# Patient Record
Sex: Male | Born: 1935
Health system: Southern US, Community
[De-identification: ages and names within clinical notes are randomized; demographics above are authoritative.]

## PROBLEM LIST (undated history)

## (undated) DIAGNOSIS — S32040A Wedge compression fracture of fourth lumbar vertebra, initial encounter for closed fracture: Secondary | ICD-10-CM

## (undated) DIAGNOSIS — S22080A Wedge compression fracture of T11-T12 vertebra, initial encounter for closed fracture: Secondary | ICD-10-CM

## (undated) DIAGNOSIS — I471 Supraventricular tachycardia, unspecified: Secondary | ICD-10-CM

## (undated) DIAGNOSIS — E785 Hyperlipidemia, unspecified: Secondary | ICD-10-CM

## (undated) DIAGNOSIS — E119 Type 2 diabetes mellitus without complications: Secondary | ICD-10-CM

## (undated) DIAGNOSIS — G4730 Sleep apnea, unspecified: Secondary | ICD-10-CM

## (undated) DIAGNOSIS — I4891 Unspecified atrial fibrillation: Secondary | ICD-10-CM

## (undated) DIAGNOSIS — I10 Essential (primary) hypertension: Secondary | ICD-10-CM

## (undated) DIAGNOSIS — E559 Vitamin D deficiency, unspecified: Secondary | ICD-10-CM

## (undated) DIAGNOSIS — I499 Cardiac arrhythmia, unspecified: Secondary | ICD-10-CM

## (undated) HISTORY — PX: CARDIOVERSION: SHX1299

## (undated) HISTORY — DX: Vitamin D deficiency, unspecified: E55.9

## (undated) HISTORY — DX: Essential (primary) hypertension: I10

## (undated) HISTORY — DX: Sleep apnea, unspecified: G47.30

## (undated) HISTORY — PX: HIATAL HERNIA REPAIR: SHX195

## (undated) HISTORY — DX: Unspecified atrial fibrillation: I48.91

## (undated) HISTORY — DX: Supraventricular tachycardia, unspecified: I47.10

## (undated) HISTORY — PX: KYPHOPLASTY: SHX5884

## (undated) HISTORY — DX: Type 2 diabetes mellitus without complications: E11.9

## (undated) HISTORY — DX: Wedge compression fracture of T11-T12 vertebra, initial encounter for closed fracture: S22.080A

## (undated) HISTORY — DX: Wedge compression fracture of fourth lumbar vertebra, initial encounter for closed fracture: S32.040A

## (undated) HISTORY — DX: Supraventricular tachycardia: I47.1

## (undated) HISTORY — PX: ABLATION: SHX5711

## (undated) HISTORY — DX: Hyperlipidemia, unspecified: E78.5

## (undated) HISTORY — PX: SKIN CANCER EXCISION: SHX779

## (undated) HISTORY — DX: Cardiac arrhythmia, unspecified: I49.9

---

## 2011-11-22 DIAGNOSIS — L600 Ingrowing nail: Secondary | ICD-10-CM | POA: Diagnosis not present

## 2011-11-22 DIAGNOSIS — L84 Corns and callosities: Secondary | ICD-10-CM | POA: Diagnosis not present

## 2011-11-22 DIAGNOSIS — E1149 Type 2 diabetes mellitus with other diabetic neurological complication: Secondary | ICD-10-CM | POA: Diagnosis not present

## 2011-11-28 DIAGNOSIS — I10 Essential (primary) hypertension: Secondary | ICD-10-CM | POA: Diagnosis not present

## 2011-11-28 DIAGNOSIS — N058 Unspecified nephritic syndrome with other morphologic changes: Secondary | ICD-10-CM | POA: Diagnosis not present

## 2011-11-28 DIAGNOSIS — E119 Type 2 diabetes mellitus without complications: Secondary | ICD-10-CM | POA: Diagnosis not present

## 2011-11-28 DIAGNOSIS — E785 Hyperlipidemia, unspecified: Secondary | ICD-10-CM | POA: Diagnosis not present

## 2011-12-12 DIAGNOSIS — I498 Other specified cardiac arrhythmias: Secondary | ICD-10-CM | POA: Diagnosis not present

## 2012-01-27 DIAGNOSIS — L600 Ingrowing nail: Secondary | ICD-10-CM | POA: Diagnosis not present

## 2012-01-27 DIAGNOSIS — L84 Corns and callosities: Secondary | ICD-10-CM | POA: Diagnosis not present

## 2012-01-27 DIAGNOSIS — E1149 Type 2 diabetes mellitus with other diabetic neurological complication: Secondary | ICD-10-CM | POA: Diagnosis not present

## 2012-03-25 DIAGNOSIS — E119 Type 2 diabetes mellitus without complications: Secondary | ICD-10-CM | POA: Diagnosis not present

## 2012-03-26 DIAGNOSIS — I6789 Other cerebrovascular disease: Secondary | ICD-10-CM | POA: Diagnosis not present

## 2012-03-26 DIAGNOSIS — E119 Type 2 diabetes mellitus without complications: Secondary | ICD-10-CM | POA: Diagnosis not present

## 2012-03-26 DIAGNOSIS — I10 Essential (primary) hypertension: Secondary | ICD-10-CM | POA: Diagnosis not present

## 2012-03-26 DIAGNOSIS — I4891 Unspecified atrial fibrillation: Secondary | ICD-10-CM | POA: Diagnosis not present

## 2012-03-30 DIAGNOSIS — L84 Corns and callosities: Secondary | ICD-10-CM | POA: Diagnosis not present

## 2012-03-30 DIAGNOSIS — L600 Ingrowing nail: Secondary | ICD-10-CM | POA: Diagnosis not present

## 2012-03-30 DIAGNOSIS — E1149 Type 2 diabetes mellitus with other diabetic neurological complication: Secondary | ICD-10-CM | POA: Diagnosis not present

## 2012-04-20 DIAGNOSIS — C44519 Basal cell carcinoma of skin of other part of trunk: Secondary | ICD-10-CM | POA: Diagnosis not present

## 2012-04-20 DIAGNOSIS — Z85828 Personal history of other malignant neoplasm of skin: Secondary | ICD-10-CM | POA: Diagnosis not present

## 2012-04-20 DIAGNOSIS — L570 Actinic keratosis: Secondary | ICD-10-CM | POA: Diagnosis not present

## 2012-05-08 DIAGNOSIS — C4491 Basal cell carcinoma of skin, unspecified: Secondary | ICD-10-CM | POA: Diagnosis not present

## 2012-05-08 DIAGNOSIS — C44519 Basal cell carcinoma of skin of other part of trunk: Secondary | ICD-10-CM | POA: Diagnosis not present

## 2012-06-01 DIAGNOSIS — L600 Ingrowing nail: Secondary | ICD-10-CM | POA: Diagnosis not present

## 2012-06-01 DIAGNOSIS — E1159 Type 2 diabetes mellitus with other circulatory complications: Secondary | ICD-10-CM | POA: Diagnosis not present

## 2012-06-01 DIAGNOSIS — L84 Corns and callosities: Secondary | ICD-10-CM | POA: Diagnosis not present

## 2012-06-22 DIAGNOSIS — Z85828 Personal history of other malignant neoplasm of skin: Secondary | ICD-10-CM | POA: Diagnosis not present

## 2012-06-22 DIAGNOSIS — L738 Other specified follicular disorders: Secondary | ICD-10-CM | POA: Diagnosis not present

## 2012-06-22 DIAGNOSIS — L570 Actinic keratosis: Secondary | ICD-10-CM | POA: Diagnosis not present

## 2012-06-29 DIAGNOSIS — E119 Type 2 diabetes mellitus without complications: Secondary | ICD-10-CM | POA: Diagnosis not present

## 2012-08-03 DIAGNOSIS — L84 Corns and callosities: Secondary | ICD-10-CM | POA: Diagnosis not present

## 2012-08-03 DIAGNOSIS — L600 Ingrowing nail: Secondary | ICD-10-CM | POA: Diagnosis not present

## 2012-08-03 DIAGNOSIS — L97509 Non-pressure chronic ulcer of other part of unspecified foot with unspecified severity: Secondary | ICD-10-CM | POA: Diagnosis not present

## 2012-08-03 DIAGNOSIS — E1149 Type 2 diabetes mellitus with other diabetic neurological complication: Secondary | ICD-10-CM | POA: Diagnosis not present

## 2012-08-10 DIAGNOSIS — L97509 Non-pressure chronic ulcer of other part of unspecified foot with unspecified severity: Secondary | ICD-10-CM | POA: Diagnosis not present

## 2012-09-07 DIAGNOSIS — E119 Type 2 diabetes mellitus without complications: Secondary | ICD-10-CM | POA: Diagnosis not present

## 2012-09-07 DIAGNOSIS — I471 Supraventricular tachycardia: Secondary | ICD-10-CM | POA: Diagnosis not present

## 2012-09-07 DIAGNOSIS — D485 Neoplasm of uncertain behavior of skin: Secondary | ICD-10-CM | POA: Diagnosis not present

## 2012-09-10 DIAGNOSIS — I471 Supraventricular tachycardia: Secondary | ICD-10-CM | POA: Diagnosis not present

## 2012-09-17 DIAGNOSIS — D485 Neoplasm of uncertain behavior of skin: Secondary | ICD-10-CM | POA: Diagnosis not present

## 2012-09-17 DIAGNOSIS — C44319 Basal cell carcinoma of skin of other parts of face: Secondary | ICD-10-CM | POA: Diagnosis not present

## 2012-09-17 DIAGNOSIS — Z85828 Personal history of other malignant neoplasm of skin: Secondary | ICD-10-CM | POA: Diagnosis not present

## 2012-09-17 DIAGNOSIS — D0439 Carcinoma in situ of skin of other parts of face: Secondary | ICD-10-CM | POA: Diagnosis not present

## 2012-09-18 DIAGNOSIS — I471 Supraventricular tachycardia: Secondary | ICD-10-CM | POA: Diagnosis not present

## 2012-09-24 DIAGNOSIS — I498 Other specified cardiac arrhythmias: Secondary | ICD-10-CM | POA: Diagnosis not present

## 2012-09-28 DIAGNOSIS — I059 Rheumatic mitral valve disease, unspecified: Secondary | ICD-10-CM | POA: Diagnosis not present

## 2012-09-28 DIAGNOSIS — G4733 Obstructive sleep apnea (adult) (pediatric): Secondary | ICD-10-CM | POA: Diagnosis not present

## 2012-09-28 DIAGNOSIS — E119 Type 2 diabetes mellitus without complications: Secondary | ICD-10-CM | POA: Diagnosis not present

## 2012-09-28 DIAGNOSIS — I4892 Unspecified atrial flutter: Secondary | ICD-10-CM | POA: Diagnosis not present

## 2012-09-28 DIAGNOSIS — I10 Essential (primary) hypertension: Secondary | ICD-10-CM | POA: Diagnosis not present

## 2012-09-28 DIAGNOSIS — I471 Supraventricular tachycardia: Secondary | ICD-10-CM | POA: Diagnosis not present

## 2012-09-28 DIAGNOSIS — Z85828 Personal history of other malignant neoplasm of skin: Secondary | ICD-10-CM | POA: Diagnosis not present

## 2012-09-28 DIAGNOSIS — E785 Hyperlipidemia, unspecified: Secondary | ICD-10-CM | POA: Diagnosis not present

## 2012-09-28 DIAGNOSIS — R9431 Abnormal electrocardiogram [ECG] [EKG]: Secondary | ICD-10-CM | POA: Diagnosis not present

## 2012-09-28 DIAGNOSIS — I079 Rheumatic tricuspid valve disease, unspecified: Secondary | ICD-10-CM | POA: Diagnosis not present

## 2012-09-28 DIAGNOSIS — I4891 Unspecified atrial fibrillation: Secondary | ICD-10-CM | POA: Diagnosis not present

## 2012-09-28 DIAGNOSIS — I4460 Unspecified fascicular block: Secondary | ICD-10-CM | POA: Diagnosis not present

## 2012-09-28 DIAGNOSIS — Z7982 Long term (current) use of aspirin: Secondary | ICD-10-CM | POA: Diagnosis not present

## 2012-09-28 DIAGNOSIS — Z87891 Personal history of nicotine dependence: Secondary | ICD-10-CM | POA: Diagnosis not present

## 2012-09-28 DIAGNOSIS — Z7901 Long term (current) use of anticoagulants: Secondary | ICD-10-CM | POA: Diagnosis not present

## 2012-09-28 DIAGNOSIS — I440 Atrioventricular block, first degree: Secondary | ICD-10-CM | POA: Diagnosis not present

## 2012-09-28 DIAGNOSIS — Q211 Atrial septal defect: Secondary | ICD-10-CM | POA: Diagnosis not present

## 2012-09-28 DIAGNOSIS — I4949 Other premature depolarization: Secondary | ICD-10-CM | POA: Diagnosis not present

## 2012-10-03 DIAGNOSIS — Z7982 Long term (current) use of aspirin: Secondary | ICD-10-CM | POA: Diagnosis not present

## 2012-10-03 DIAGNOSIS — I10 Essential (primary) hypertension: Secondary | ICD-10-CM | POA: Diagnosis not present

## 2012-10-03 DIAGNOSIS — M7989 Other specified soft tissue disorders: Secondary | ICD-10-CM | POA: Diagnosis not present

## 2012-10-03 DIAGNOSIS — J9819 Other pulmonary collapse: Secondary | ICD-10-CM | POA: Diagnosis not present

## 2012-10-03 DIAGNOSIS — I4891 Unspecified atrial fibrillation: Secondary | ICD-10-CM | POA: Diagnosis not present

## 2012-10-03 DIAGNOSIS — G4730 Sleep apnea, unspecified: Secondary | ICD-10-CM | POA: Diagnosis not present

## 2012-10-03 DIAGNOSIS — R0789 Other chest pain: Secondary | ICD-10-CM | POA: Diagnosis not present

## 2012-10-03 DIAGNOSIS — R609 Edema, unspecified: Secondary | ICD-10-CM | POA: Diagnosis not present

## 2012-10-03 DIAGNOSIS — E785 Hyperlipidemia, unspecified: Secondary | ICD-10-CM | POA: Diagnosis not present

## 2012-10-03 DIAGNOSIS — E119 Type 2 diabetes mellitus without complications: Secondary | ICD-10-CM | POA: Diagnosis not present

## 2012-10-03 DIAGNOSIS — Z9889 Other specified postprocedural states: Secondary | ICD-10-CM | POA: Diagnosis not present

## 2012-10-03 DIAGNOSIS — R29898 Other symptoms and signs involving the musculoskeletal system: Secondary | ICD-10-CM | POA: Diagnosis not present

## 2012-10-03 DIAGNOSIS — R5381 Other malaise: Secondary | ICD-10-CM | POA: Diagnosis not present

## 2012-10-03 DIAGNOSIS — Z85828 Personal history of other malignant neoplasm of skin: Secondary | ICD-10-CM | POA: Diagnosis not present

## 2012-10-03 DIAGNOSIS — R9431 Abnormal electrocardiogram [ECG] [EKG]: Secondary | ICD-10-CM | POA: Diagnosis not present

## 2012-10-03 DIAGNOSIS — Z87891 Personal history of nicotine dependence: Secondary | ICD-10-CM | POA: Diagnosis not present

## 2012-10-03 DIAGNOSIS — Z7901 Long term (current) use of anticoagulants: Secondary | ICD-10-CM | POA: Diagnosis not present

## 2012-10-03 DIAGNOSIS — R002 Palpitations: Secondary | ICD-10-CM | POA: Diagnosis not present

## 2012-10-03 DIAGNOSIS — R5383 Other fatigue: Secondary | ICD-10-CM | POA: Diagnosis not present

## 2012-10-05 DIAGNOSIS — C44319 Basal cell carcinoma of skin of other parts of face: Secondary | ICD-10-CM | POA: Diagnosis not present

## 2012-10-07 DIAGNOSIS — L600 Ingrowing nail: Secondary | ICD-10-CM | POA: Diagnosis not present

## 2012-10-07 DIAGNOSIS — E1149 Type 2 diabetes mellitus with other diabetic neurological complication: Secondary | ICD-10-CM | POA: Diagnosis not present

## 2012-10-07 DIAGNOSIS — L84 Corns and callosities: Secondary | ICD-10-CM | POA: Diagnosis not present

## 2012-10-09 DIAGNOSIS — E785 Hyperlipidemia, unspecified: Secondary | ICD-10-CM | POA: Diagnosis not present

## 2012-10-09 DIAGNOSIS — E119 Type 2 diabetes mellitus without complications: Secondary | ICD-10-CM | POA: Diagnosis not present

## 2012-10-09 DIAGNOSIS — I10 Essential (primary) hypertension: Secondary | ICD-10-CM | POA: Diagnosis not present

## 2012-11-17 DIAGNOSIS — E875 Hyperkalemia: Secondary | ICD-10-CM | POA: Diagnosis not present

## 2013-01-29 DIAGNOSIS — L600 Ingrowing nail: Secondary | ICD-10-CM | POA: Diagnosis not present

## 2013-01-29 DIAGNOSIS — L84 Corns and callosities: Secondary | ICD-10-CM | POA: Diagnosis not present

## 2013-01-29 DIAGNOSIS — E1149 Type 2 diabetes mellitus with other diabetic neurological complication: Secondary | ICD-10-CM | POA: Diagnosis not present

## 2013-02-04 DIAGNOSIS — I4891 Unspecified atrial fibrillation: Secondary | ICD-10-CM | POA: Diagnosis not present

## 2013-04-05 DIAGNOSIS — L600 Ingrowing nail: Secondary | ICD-10-CM | POA: Diagnosis not present

## 2013-04-05 DIAGNOSIS — E1149 Type 2 diabetes mellitus with other diabetic neurological complication: Secondary | ICD-10-CM | POA: Diagnosis not present

## 2013-04-05 DIAGNOSIS — L97509 Non-pressure chronic ulcer of other part of unspecified foot with unspecified severity: Secondary | ICD-10-CM | POA: Diagnosis not present

## 2013-04-05 DIAGNOSIS — L84 Corns and callosities: Secondary | ICD-10-CM | POA: Diagnosis not present

## 2013-04-14 DIAGNOSIS — L97509 Non-pressure chronic ulcer of other part of unspecified foot with unspecified severity: Secondary | ICD-10-CM | POA: Diagnosis not present

## 2013-06-07 DIAGNOSIS — L97509 Non-pressure chronic ulcer of other part of unspecified foot with unspecified severity: Secondary | ICD-10-CM | POA: Diagnosis not present

## 2013-06-08 DIAGNOSIS — L03119 Cellulitis of unspecified part of limb: Secondary | ICD-10-CM | POA: Diagnosis not present

## 2013-06-08 DIAGNOSIS — M79609 Pain in unspecified limb: Secondary | ICD-10-CM | POA: Diagnosis not present

## 2013-06-08 DIAGNOSIS — L02619 Cutaneous abscess of unspecified foot: Secondary | ICD-10-CM | POA: Diagnosis not present

## 2013-06-08 DIAGNOSIS — L97509 Non-pressure chronic ulcer of other part of unspecified foot with unspecified severity: Secondary | ICD-10-CM | POA: Diagnosis not present

## 2013-06-09 ENCOUNTER — Ambulatory Visit (INDEPENDENT_AMBULATORY_CARE_PROVIDER_SITE_OTHER): Payer: Medicare Other | Admitting: Family Medicine

## 2013-06-09 ENCOUNTER — Encounter: Payer: Self-pay | Admitting: Family Medicine

## 2013-06-09 VITALS — BP 152/90 | HR 91 | Temp 96.3°F | Ht 70.0 in | Wt 208.0 lb

## 2013-06-09 DIAGNOSIS — E119 Type 2 diabetes mellitus without complications: Secondary | ICD-10-CM | POA: Diagnosis not present

## 2013-06-09 DIAGNOSIS — I499 Cardiac arrhythmia, unspecified: Secondary | ICD-10-CM

## 2013-06-09 DIAGNOSIS — E559 Vitamin D deficiency, unspecified: Secondary | ICD-10-CM | POA: Diagnosis not present

## 2013-06-09 DIAGNOSIS — E785 Hyperlipidemia, unspecified: Secondary | ICD-10-CM

## 2013-06-09 DIAGNOSIS — IMO0002 Reserved for concepts with insufficient information to code with codable children: Secondary | ICD-10-CM

## 2013-06-09 DIAGNOSIS — I10 Essential (primary) hypertension: Secondary | ICD-10-CM

## 2013-06-09 DIAGNOSIS — I4891 Unspecified atrial fibrillation: Secondary | ICD-10-CM

## 2013-06-09 DIAGNOSIS — M549 Dorsalgia, unspecified: Secondary | ICD-10-CM

## 2013-06-09 LAB — POCT GLYCOSYLATED HEMOGLOBIN (HGB A1C): Hemoglobin A1C: 5.9

## 2013-06-09 MED ORDER — MELOXICAM 15 MG PO TABS
15.0000 mg | ORAL_TABLET | Freq: Every day | ORAL | Status: DC
Start: 2013-06-09 — End: 2013-09-08

## 2013-06-09 MED ORDER — CYCLOBENZAPRINE HCL 10 MG PO TABS: 10.0000 mg | ORAL_TABLET | Freq: Three times a day (TID) | ORAL | Status: DC | PRN

## 2013-06-09 NOTE — Patient Instructions (Signed)
Back Pain, Adult  Low back pain is very common. About 1 in 5 people have back pain. The cause of low back pain is rarely dangerous. The pain often gets better over time. About half of people with a sudden onset of back pain feel better in just 2 weeks. About 8 in 10 people feel better by 6 weeks.   CAUSES  Some common causes of back pain include:  · Strain of the muscles or ligaments supporting the spine.  · Wear and tear (degeneration) of the spinal discs.  · Arthritis.  · Direct injury to the back.  DIAGNOSIS  Most of the time, the direct cause of low back pain is not known. However, back pain can be treated effectively even when the exact cause of the pain is unknown. Answering your caregiver's questions about your overall health and symptoms is one of the most accurate ways to make sure the cause of your pain is not dangerous. If your caregiver needs more information, he or she may order lab work or imaging tests (X-rays or MRIs). However, even if imaging tests show changes in your back, this usually does not require surgery.  HOME CARE INSTRUCTIONS  For many people, back pain returns. Since low back pain is rarely dangerous, it is often a condition that people can learn to manage on their own.   · Remain active. It is stressful on the back to sit or stand in one place. Do not sit, drive, or stand in one place for more than 30 minutes at a time. Take short walks on level surfaces as soon as pain allows. Try to increase the length of time you walk each day.  · Do not stay in bed. Resting more than 1 or 2 days can delay your recovery.  · Do not avoid exercise or work. Your body is made to move. It is not dangerous to be active, even though your back may hurt. Your back will likely heal faster if you return to being active before your pain is gone.  · Pay attention to your body when you  bend and lift. Many people have less discomfort when lifting if they bend their knees, keep the load close to their bodies, and  avoid twisting. Often, the most comfortable positions are those that put less stress on your recovering back.  · Find a comfortable position to sleep. Use a firm mattress and lie on your side with your knees slightly bent. If you lie on your back, put a pillow under your knees.  · Only take over-the-counter or prescription medicines as directed by your caregiver. Over-the-counter medicines to reduce pain and inflammation are often the most helpful. Your caregiver may prescribe muscle relaxant drugs. These medicines help dull your pain so you can more quickly return to your normal activities and healthy exercise.  · Put ice on the injured area.  · Put ice in a plastic bag.  · Place a towel between your skin and the bag.  · Leave the ice on for 15-20 minutes, 3-4 times a day for the first 2 to 3 days. After that, ice and heat may be alternated to reduce pain and spasms.  · Ask your caregiver about trying back exercises and gentle massage. This may be of some benefit.  · Avoid feeling anxious or stressed. Stress increases muscle tension and can worsen back pain. It is important to recognize when you are anxious or stressed and learn ways to manage it. Exercise is a great option.  SEEK MEDICAL CARE IF:  · You have pain that is not relieved with rest or   medicine.  · You have pain that does not improve in 1 week.  · You have new symptoms.  · You are generally not feeling well.  SEEK IMMEDIATE MEDICAL CARE IF:   · You have pain that radiates from your back into your legs.  · You develop new bowel or bladder control problems.  · You have unusual weakness or numbness in your arms or legs.  · You develop nausea or vomiting.  · You develop abdominal pain.  · You feel faint.  Document Released: 11/04/2005 Document Revised: 05/05/2012 Document Reviewed: 03/25/2011  Exit[employer]® Patient Information ©2014 [employer].

## 2013-06-09 NOTE — Progress Notes (Signed)
  Subjective:    Patient ID: Jeremy Vargas, male    DOB: [birth year], 79 y.o.   MRN: [id number]  HPI This 79 y.o. male presents for evaluation of establishment.  He has hx of tachycardia and had Been seeing cardiology in [place] Dr. Jaynie Collins.  He has just moved here.  He had some problems with Tachycardia and had to undergo 2 ablations and he went into afib and complicated with pericarditis He has had to have 2 cardioversions before he converted.  He has been stable for the last 9 months. He has hx of DM and has an ulcer on his left toe and he is seeing a podiatrist.  He has hx of T12 compression  Fracture and recently has injured his back when he picked something up.    Review of Systems C/o right second toe ulcer. No chest pain, SOB, HA, dizziness, vision change, N/V, diarrhea, constipation, dysuria, urinary urgency or frequency, myalgias, arthralgias or rash.     Objective:   Physical Exam Vital signs noted  Chronically ill appearing male.  HEENT - Head atraumatic Normocephalic                Eyes - PERRLA, Conjuctiva - clear Sclera- Clear EOMI                Ears - EAC's Wnl TM's Wnl Gross Hearing WNL                Nose - Nares patent                 Throat - oropharanx wnl Respiratory - Lungs CTA bilateral Cardiac - RRR S1 and S2 without murmur GI - Abdomen soft Nontender and bowel sounds active x 4 Extremities - No edema. Neuro - Grossly intact. MS - TTP Lumbar spine.      Assessment & Plan:  Diabetes - Plan: POCT glycosylated hemoglobin (Hb A1C) results are 5.9%.  Recommend he cut  Back on metformin to 1000mg  one half bid and follow up in 3 months.  Follow up with podiatry for Foot ulcer.  Hyperlipidemia - Plan: Lipid panel  Hypertension - Plan: COMPLETE METABOLIC PANEL WITH GFR  Unspecified vitamin D deficiency continue vitaminD 2000 iu po qd  Irregular heart rate - Plan: EKG 12-Lead - SB with 1st degree AV block.  Refer to cardiology for Hx of atrial fib, SVT, and  s/p cardioversions.  He was last seen by cardiology 9 months ago after His last cardioversion.  Continue flecanide and Diltiazem.  Atrial fibrillation - Plan: Ambulatory referral to Cardiology  Back pain - Plan: cyclobenzaprine (FLEXERIL) 10 MG tablet, meloxicam (MOBIC) 15 MG tablet. Follow up prn if not better.  T12 compresson fx - He states he has seen Orthopedics and was told it is too old to treat.  Follow up in 3 months.

## 2013-06-10 LAB — COMPLETE METABOLIC PANEL WITH GFR
ALT: 13 U/L (ref 0–53)
AST: 16 U/L (ref 0–37)
Albumin: 3.8 g/dL (ref 3.5–5.2)
Alkaline Phosphatase: 114 U/L (ref 39–117)
BUN: 17 mg/dL (ref 6–23)
CO2: 26 mEq/L (ref 19–32)
Calcium: 9.2 mg/dL (ref 8.4–10.5)
Chloride: 100 mEq/L (ref 96–112)
Creat: 1.39 mg/dL — ABNORMAL HIGH (ref 0.50–1.35)
GFR, Est African American: 55 mL/min — ABNORMAL LOW
GFR, Est Non African American: 48 mL/min — ABNORMAL LOW
Glucose, Bld: 186 mg/dL — ABNORMAL HIGH (ref 70–99)
Potassium: 5.3 mEq/L (ref 3.5–5.3)
Sodium: 136 mEq/L (ref 135–145)
Total Bilirubin: 0.4 mg/dL (ref 0.3–1.2)
Total Protein: 6.8 g/dL (ref 6.0–8.3)

## 2013-06-10 LAB — LIPID PANEL
Cholesterol: 185 mg/dL (ref 0–200)
HDL: 39 mg/dL — ABNORMAL LOW (ref >39)
LDL Cholesterol: 112 mg/dL — ABNORMAL HIGH (ref 0–99)
Total CHOL/HDL Ratio: 4.7 Ratio
Triglycerides: 171 mg/dL — ABNORMAL HIGH (ref <150)
VLDL: 34 mg/dL (ref 0–40)

## 2013-06-11 ENCOUNTER — Other Ambulatory Visit: Payer: Self-pay | Admitting: Family Medicine

## 2013-06-11 DIAGNOSIS — E785 Hyperlipidemia, unspecified: Secondary | ICD-10-CM

## 2013-06-11 MED ORDER — PRAVASTATIN SODIUM 20 MG PO TABS: 20.0000 mg | ORAL_TABLET | Freq: Every day | ORAL | Status: DC

## 2013-06-17 DIAGNOSIS — M5137 Other intervertebral disc degeneration, lumbosacral region: Secondary | ICD-10-CM | POA: Diagnosis not present

## 2013-06-17 DIAGNOSIS — M25559 Pain in unspecified hip: Secondary | ICD-10-CM | POA: Diagnosis not present

## 2013-06-17 DIAGNOSIS — M999 Biomechanical lesion, unspecified: Secondary | ICD-10-CM | POA: Diagnosis not present

## 2013-06-17 DIAGNOSIS — IMO0002 Reserved for concepts with insufficient information to code with codable children: Secondary | ICD-10-CM | POA: Diagnosis not present

## 2013-06-22 DIAGNOSIS — L97509 Non-pressure chronic ulcer of other part of unspecified foot with unspecified severity: Secondary | ICD-10-CM | POA: Diagnosis not present

## 2013-06-30 ENCOUNTER — Institutional Professional Consult (permissible substitution): Payer: Self-pay | Admitting: Cardiology

## 2013-06-30 ENCOUNTER — Encounter: Payer: Self-pay | Admitting: *Deleted

## 2013-06-30 ENCOUNTER — Ambulatory Visit (INDEPENDENT_AMBULATORY_CARE_PROVIDER_SITE_OTHER): Payer: Medicare Other | Admitting: Cardiology

## 2013-06-30 VITALS — BP 152/88 | HR 91 | Ht 70.0 in | Wt 209.0 lb

## 2013-06-30 DIAGNOSIS — I4891 Unspecified atrial fibrillation: Secondary | ICD-10-CM

## 2013-06-30 MED ORDER — FLECAINIDE ACETATE 100 MG PO TABS: 100.0000 mg | ORAL_TABLET | Freq: Two times a day (BID) | ORAL | Status: DC

## 2013-06-30 MED ORDER — DILTIAZEM HCL 120 MG PO TABS: 120.0000 mg | ORAL_TABLET | Freq: Every day | ORAL | Status: DC

## 2013-06-30 NOTE — Progress Notes (Signed)
HPI The patient presents as a new patient for evaluation of atrial fibrillation. He is moving here from [place]. He reports having SVT ablated twice around 2010. He had atrial fibrillation following this and had cardioversion twice. Since being on flecainide he has maintained sinus rhythm. He feels it when he is out rhythm and he has not noticed this. He's had a few skipped beats but no sustained tachypalpitations. He has not had any presyncope or syncope. He denies any chest pressure, neck or arm discomfort. He's had no weight gain or edema. He does household chores although he's been a little limited by back pain. He does report a normal echo and stress test in the not too distant past although I do not have these records are he denies any shortness of breath, PND or orthopnea.  Allergies  Allergen Reactions  . Betadine [Povidone Iodine]     Current Outpatient Prescriptions  Medication Sig Dispense Refill  . cyclobenzaprine (FLEXERIL) 10 MG tablet Take 1 tablet (10 mg total) by mouth 3 (three) times daily as needed for muscle spasms.  30 tablet  1  . diltiazem (CARDIZEM) 120 MG tablet Take 120 mg by mouth daily.      . flecainide (TAMBOCOR) 100 MG tablet Take 100 mg by mouth 2 (two) times daily.      . meloxicam (MOBIC) 15 MG tablet Take 1 tablet (15 mg total) by mouth daily.  30 tablet  3  . metFORMIN (GLUCOPHAGE) 1000 MG tablet Take 1,000 mg by mouth 2 (two) times daily with a meal.      . pravastatin (PRAVACHOL) 20 MG tablet Take 1 tablet (20 mg total) by mouth daily.  90 tablet  3   No current facility-administered medications for this visit.    Past Medical History  Diagnosis Date  . Diabetes mellitus without complication   . Other and unspecified hyperlipidemia   . Unspecified essential hypertension   . Cardiac dysrhythmia, unspecified   . Atrial fibrillation   . Backache, unspecified   . Late effect of fracture of spine and trunk without mention of spinal cord lesion   .  SVT (supraventricular tachycardia)   . Compression fracture of T12 vertebra   . Vitamin D deficiency   . Compression fracture of L4 lumbar vertebra     Past Surgical History  Procedure Laterality Date  . Ablation      x2  . Cardioversion      x2  . External ear surgery      No family history on file.  History   Social History  . Marital Status: Married    Spouse Name: N/A    Number of Children: N/A  . Years of Education: N/A   Occupational History  . Not on file.   Social History Main Topics  . Smoking status: Former Games developer  . Smokeless tobacco: Not on file  . Alcohol Use: Not on file  . Drug Use: Not on file  . Sexual Activity: Not on file   Other Topics Concern  . Not on file   Social History Narrative  . No narrative on file    ROS:  Positive for back pain. Otherwise as stated in the HPI and negative for all other systems.  PHYSICAL EXAM BP 152/88  Pulse 91  Ht 5\' 10"  (1.778 m)  Wt 209 lb (94.802 kg)  BMI 29.99 kg/m2 GENERAL:  Well appearing HEENT:  Pupils equal round and reactive, fundi not visualized, oral mucosa unremarkable NECK:  No jugular venous distention, waveform within normal limits, carotid upstroke brisk and symmetric, no bruits, no thyromegaly LYMPHATICS:  No cervical, inguinal adenopathy LUNGS:  Clear to auscultation bilaterally BACK:  No CVA tenderness CHEST:  Unremarkable HEART:  PMI not displaced or sustained,S1 and S2 within normal limits, no S3, no S4, no clicks, no rubs, no murmurs ABD:  Flat, positive bowel sounds normal in frequency in pitch, no bruits, no rebound, no guarding, no midline pulsatile mass, no hepatomegaly, no splenomegaly EXT:  2 plus pulses throughout, no edema, no cyanosis no clubbing SKIN:  No rashes no nodules NEURO:  Cranial nerves II through XII grossly intact, motor grossly intact throughout PSYCH:  Cognitively intact, oriented to person place and time   EKG:  Sinus rhythm, rate 91, left axis deviation,  left anterior fascicular block, poor anterior R wave progression, no acute ST-T wave changes. 06/30/2013  ASSESSMENT AND PLAN  Atrial fibrillation - he is maintaining sinus rhythm. He was taken off anticoagulation by cardiologist on file. At this point change in therapy is indicated. I will review the old records when available.  SVT -  He has had no recurrence of this. No change in therapy is indicated.  HTN -  He reports that he no longer has this.he will continue the meds as listed.  And blood pressure elevation today is an aberration. His wife is a retired Engineer, civil (consulting) and check on this.

## 2013-06-30 NOTE — Patient Instructions (Addendum)
REFILLS FOR YOUR CARDIZEM AND FLECAINIDE WERE SENT IN TODAY   PLEASE FOLLOW UP WITH DR. HOCHREIN IN 1 YEAR; WE WILL SEND OUT A REMINDER LETTER TO YOU

## 2013-07-01 ENCOUNTER — Encounter: Payer: Self-pay | Admitting: Cardiology

## 2013-07-13 DIAGNOSIS — L97509 Non-pressure chronic ulcer of other part of unspecified foot with unspecified severity: Secondary | ICD-10-CM | POA: Diagnosis not present

## 2013-08-16 DIAGNOSIS — H52229 Regular astigmatism, unspecified eye: Secondary | ICD-10-CM | POA: Diagnosis not present

## 2013-08-16 DIAGNOSIS — Z961 Presence of intraocular lens: Secondary | ICD-10-CM | POA: Diagnosis not present

## 2013-08-16 DIAGNOSIS — E119 Type 2 diabetes mellitus without complications: Secondary | ICD-10-CM | POA: Diagnosis not present

## 2013-08-16 DIAGNOSIS — H5200 Hypermetropia, unspecified eye: Secondary | ICD-10-CM | POA: Diagnosis not present

## 2013-08-25 ENCOUNTER — Institutional Professional Consult (permissible substitution): Payer: Self-pay | Admitting: Cardiology

## 2013-09-08 ENCOUNTER — Encounter: Payer: Self-pay | Admitting: Family Medicine

## 2013-09-08 ENCOUNTER — Ambulatory Visit (INDEPENDENT_AMBULATORY_CARE_PROVIDER_SITE_OTHER): Payer: Medicare Other | Admitting: Family Medicine

## 2013-09-08 VITALS — BP 145/81 | HR 78 | Temp 97.6°F | Ht 70.0 in | Wt 208.8 lb

## 2013-09-08 DIAGNOSIS — L97509 Non-pressure chronic ulcer of other part of unspecified foot with unspecified severity: Secondary | ICD-10-CM | POA: Diagnosis not present

## 2013-09-08 DIAGNOSIS — E11621 Type 2 diabetes mellitus with foot ulcer: Secondary | ICD-10-CM

## 2013-09-08 DIAGNOSIS — E1169 Type 2 diabetes mellitus with other specified complication: Secondary | ICD-10-CM | POA: Diagnosis not present

## 2013-09-08 MED ORDER — CEFTRIAXONE SODIUM 1 G IJ SOLR
1.0000 g | Freq: Once | INTRAMUSCULAR | Status: AC
  Administered 2013-09-08: 1 g via INTRAMUSCULAR

## 2013-09-08 MED ORDER — AMOXICILLIN-POT CLAVULANATE 875-125 MG PO TABS: 1.0000 | ORAL_TABLET | Freq: Two times a day (BID) | ORAL | Status: DC

## 2013-09-08 NOTE — Progress Notes (Signed)
  Subjective:    Patient ID: Jeremy Vargas, male    DOB: [birth year], 79 y.o.   MRN: [id number]  HPI This 79 y.o. male presents for evaluation of ulcer on hes left second toe.  He states he has  Had this before and he has called and made appointment with podiatry tomorrow.   Review of Systems C/o left foot wound No chest pain, SOB, HA, dizziness, vision change, N/V, diarrhea, constipation, dysuria, urinary urgency or frequency, myalgias, arthralgias or rash.     Objective:   Physical Exam  Vital signs noted  Well developed well nourished male.  HEENT - Head atraumatic Normocephalic                Eyes - PERRLA, Conjuctiva - clear Sclera- Clear EOMI                Ears - EAC's Wnl TM's Wnl Gross Hearing WNL                Throat - oropharanx wnl Respiratory - Lungs CTA bilateral Cardiac - RRR S1 and S2 without murmur GI - Abdomen soft Nontender and bowel sounds active x 4. Skin - Ulcer left second toe wound cx obtained. Dressing with silvadene ointment applied.      Assessment & Plan:  Diabetic foot ulcer - Plan: cefTRIAXone (ROCEPHIN) injection 1 g, Aerobic culture, amoxicillin-clavulanate (AUGMENTIN) 875-125 MG per tablet Dressing left toe applied.    Deatra Canter FNP

## 2013-09-08 NOTE — Patient Instructions (Signed)
Ceftriaxone injection  What is this medicine?  CEFTRIAXONE (sef try AX one) is a cephalosporin antibiotic. It is used to treat certain kinds of bacterial infections. It It will not work for colds, flu, or other viral infections.  This medicine may be used for other purposes; ask your health care provider or pharmacist if you have questions.  What should I tell my health care provider before I take this medicine?  They need to know if you have any of these conditions:  -any chronic illness  -bowel disease, like colitis  -both kidney and liver disease  -high bilirubin level in newborn patients  -an unusual or allergic reaction to ceftriaxone, other cephalosporin or penicillin antibiotics, foods, dyes or preservatives  -pregnant or trying to get pregnant  -breast-feeding  How should I use this medicine?  This medicine is injected into a muscle or infused it into a vein. It is usually given in a medical office or clinic. If you are to give this medicine you will be taught how to inject it. Follow instructions carefully. Use your doses at regular intervals. Do not take your medicine more often than directed. Do not skip doses or stop your medicine early even if you feel better. Do not stop taking except on your doctor's advice.  Talk to your pediatrician regarding the use of this medicine in children. Special care may be needed.  Overdosage: If you think you have taken too much of this medicine contact a poison control center or emergency room at once.  NOTE: This medicine is only for you. Do not share this medicine with others.  What if I miss a dose?  If you miss a dose, take it as soon as you can. If it is almost time for your next dose, take only that dose. Do not take double or extra doses.  What may interact with this medicine?  Do not take this medicine with any of the following medications:  -calcium  This medicine may also interact with the following medications:  -alcohol  -some other  antibiotics  -warfarin  This list may not describe all possible interactions. Give your health care provider a list of all the medicines, herbs, non-prescription drugs, or dietary supplements you use. Also tell them if you smoke, drink alcohol, or use illegal drugs. Some items may interact with your medicine.  What should I watch for while using this medicine?  Tell your doctor or health care professional if your symptoms do not improve or if they get worse.  Do not treat diarrhea with over the counter products. Contact your doctor if you have diarrhea that lasts more than 2 days or if it is severe and watery.  If you are being treated for a sexually transmitted disease, avoid sexual contact until you have finished your treatment. Having sex can infect your sexual partner.  Calcium may bind to this medicine and cause lung or kidney problems. Avoid calcium products while taking this medicine and for 48 hours after taking the last dose of this medicine.  What side effects may I notice from receiving this medicine?  Side effects that you should report to your doctor or health care professional as soon as possible:  -allergic reactions like skin rash, itching or hives, swelling of the face, lips, or tongue  -breathing problems  -fever, chills  -irregular heartbeat  -pain when passing urine  -seizures  -stomach pain, cramps  -unusual bleeding, bruising  -unusually weak or tired  Side effects that usually   do not require medical attention (report to your doctor or health care professional if they continue or are bothersome):  -diarrhea  -dizzy, drowsy  -headache  -nausea, vomiting  -pain, swelling, irritation where injected  -stomach upset  -sweating  This list may not describe all possible side effects. Call your doctor for medical advice about side effects. You may report side effects to FDA at 1-800-FDA-1088.  Where should I keep my medicine?  Keep out of the reach of children.  Store at room temperature below 25  degrees C (77 degrees F). Protect from light. Throw away any unused vials after the expiration date.  NOTE: This sheet is a summary. It may not cover all possible information. If you have questions about this medicine, talk to your doctor, pharmacist, or health care provider.   2013, [employer]vier/[employer]. (02/09/2008 1:34:22 PM)

## 2013-09-08 NOTE — Progress Notes (Signed)
Pt tolerated rocephin IM well without difficulty .

## 2013-09-09 DIAGNOSIS — E1149 Type 2 diabetes mellitus with other diabetic neurological complication: Secondary | ICD-10-CM | POA: Diagnosis not present

## 2013-09-09 DIAGNOSIS — L97509 Non-pressure chronic ulcer of other part of unspecified foot with unspecified severity: Secondary | ICD-10-CM | POA: Diagnosis not present

## 2013-09-10 LAB — AEROBIC CULTURE

## 2013-09-23 ENCOUNTER — Other Ambulatory Visit (INDEPENDENT_AMBULATORY_CARE_PROVIDER_SITE_OTHER): Payer: Medicare Other

## 2013-09-23 ENCOUNTER — Other Ambulatory Visit: Payer: Self-pay

## 2013-09-23 DIAGNOSIS — L97509 Non-pressure chronic ulcer of other part of unspecified foot with unspecified severity: Secondary | ICD-10-CM | POA: Diagnosis not present

## 2013-09-23 DIAGNOSIS — E1059 Type 1 diabetes mellitus with other circulatory complications: Secondary | ICD-10-CM | POA: Diagnosis not present

## 2013-09-23 DIAGNOSIS — E1149 Type 2 diabetes mellitus with other diabetic neurological complication: Secondary | ICD-10-CM | POA: Diagnosis not present

## 2013-09-23 LAB — POCT GLYCOSYLATED HEMOGLOBIN (HGB A1C): Hemoglobin A1C: 6.2

## 2013-11-02 DIAGNOSIS — L97509 Non-pressure chronic ulcer of other part of unspecified foot with unspecified severity: Secondary | ICD-10-CM | POA: Diagnosis not present

## 2013-11-02 DIAGNOSIS — E1149 Type 2 diabetes mellitus with other diabetic neurological complication: Secondary | ICD-10-CM | POA: Diagnosis not present

## 2013-11-02 DIAGNOSIS — B351 Tinea unguium: Secondary | ICD-10-CM | POA: Diagnosis not present

## 2013-11-02 DIAGNOSIS — L851 Acquired keratosis [keratoderma] palmaris et plantaris: Secondary | ICD-10-CM | POA: Diagnosis not present

## 2013-11-16 DIAGNOSIS — E1149 Type 2 diabetes mellitus with other diabetic neurological complication: Secondary | ICD-10-CM | POA: Diagnosis not present

## 2013-11-16 DIAGNOSIS — L97509 Non-pressure chronic ulcer of other part of unspecified foot with unspecified severity: Secondary | ICD-10-CM | POA: Diagnosis not present

## 2013-11-16 DIAGNOSIS — L851 Acquired keratosis [keratoderma] palmaris et plantaris: Secondary | ICD-10-CM | POA: Diagnosis not present

## 2013-11-16 DIAGNOSIS — B351 Tinea unguium: Secondary | ICD-10-CM | POA: Diagnosis not present

## 2013-11-26 ENCOUNTER — Ambulatory Visit (INDEPENDENT_AMBULATORY_CARE_PROVIDER_SITE_OTHER): Payer: Medicare Other | Admitting: Family Medicine

## 2013-11-26 ENCOUNTER — Ambulatory Visit (INDEPENDENT_AMBULATORY_CARE_PROVIDER_SITE_OTHER): Payer: Medicare Other

## 2013-11-26 ENCOUNTER — Encounter: Payer: Self-pay | Admitting: Family Medicine

## 2013-11-26 VITALS — BP 157/84 | HR 70 | Temp 97.9°F | Ht 70.0 in | Wt 207.0 lb

## 2013-11-26 DIAGNOSIS — J189 Pneumonia, unspecified organism: Secondary | ICD-10-CM

## 2013-11-26 DIAGNOSIS — R05 Cough: Secondary | ICD-10-CM

## 2013-11-26 DIAGNOSIS — R059 Cough, unspecified: Secondary | ICD-10-CM

## 2013-11-26 LAB — POCT CBC
Granulocyte percent: 71.6 %G (ref 37–80)
HCT, POC: 45.8 % (ref 43.5–53.7)
Hemoglobin: 14.6 g/dL (ref 14.1–18.1)
Lymph, poc: 2.2 (ref 0.6–3.4)
MCH, POC: 28.2 pg (ref 27–31.2)
MCHC: 31.8 g/dL (ref 31.8–35.4)
MCV: 88.8 fL (ref 80–97)
MPV: 6.4 fL (ref 0–99.8)
POC Granulocyte: 6.4 (ref 2–6.9)
POC LYMPH PERCENT: 24.7 %L (ref 10–50)
Platelet Count, POC: 274 10*3/uL (ref 142–424)
RBC: 5.2 M/uL (ref 4.69–6.13)
RDW, POC: 13.3 %
WBC: 9 10*3/uL (ref 4.6–10.2)

## 2013-11-26 LAB — POCT INFLUENZA A/B
Influenza A, POC: NEGATIVE
Influenza B, POC: NEGATIVE

## 2013-11-26 MED ORDER — LEVOFLOXACIN 500 MG PO TABS
500.0000 mg | ORAL_TABLET | Freq: Every day | ORAL | Status: DC
Start: 2013-11-26 — End: 2014-03-18

## 2013-11-26 MED ORDER — LEVOFLOXACIN 500 MG PO TABS: 500.0000 mg | ORAL_TABLET | Freq: Every day | ORAL | Status: DC

## 2013-11-26 NOTE — Progress Notes (Signed)
Called Levaquin into [facility] per patient's request since the drug store is closed. Spoke with pharmacist, Legrand Como, and new script as well as current med list was provided. No interactions per pharmacist. This was entered by kristin Jaimin Krupka rn

## 2013-11-26 NOTE — Progress Notes (Signed)
   Subjective:    Patient ID: Jeremy Vargas, male    DOB: May 10, 1930, 80 y.o.   MRN: [id number]  HPI This 80 y.o. male presents for evaluation of shortness of breath, balance problems,  Weakness, and coughing. He has been having fever and is having DOE   Review of Systems    No chest pain, SOB, HA, dizziness, vision change, N/V, diarrhea, constipation, dysuria, urinary urgency or frequency, myalgias, arthralgias or rash.  Objective:   Physical Exam Vital signs noted  Well developed well nourished elderly male in [facility].  HEENT - Head atraumatic Normocephalic                Eyes - PERRLA, Conjuctiva - clear Sclera- Clear EOMI                Ears - EAC's Wnl TM's Wnl Gross Hearing WNL                Throat - oropharanx wnl Respiratory - Lungs CTA bilateral, oxygen saturation 94%. Cardiac - RRR S1 and S2 without murmur GI - Abdomen soft Nontender and bowel sounds active x 4 Extremities - No edema. Neuro - Grossly intact.  CXR - Possible right infiltrate Prelimnary reading by Iverson Alamin  Results for orders placed in visit on 11/26/13  POCT INFLUENZA A/B      Result Value Range   Influenza A, POC Negative     Influenza B, POC Negative    POCT CBC      Result Value Range   WBC 9.0  4.6 - 10.2 K/uL   Lymph, poc 2.2  0.6 - 3.4   POC LYMPH PERCENT 24.7  10 - 50 %L   MID (cbc)    0 - 0.9   POC MID %    0 - 12 %M   POC Granulocyte 6.4  2 - 6.9   Granulocyte percent 71.6  37 - 80 %G   RBC 5.2  4.69 - 6.13 M/uL   Hemoglobin 14.6  14.1 - 18.1 g/dL   HCT, POC 45.8  43.5 - 53.7 %   MCV 88.8  80 - 97 fL   MCH, POC 28.2  27 - 31.2 pg   MCHC 31.8  31.8 - 35.4 g/dL   RDW, POC 13.3     Platelet Count, POC 274.0  142 - 424 K/uL   MPV 6.4  0 - 99.8 fL      Assessment & Plan:  Cough - Plan: DG Chest 2 View, POCT Influenza A/B, POCT CBC, levofloxacin (LEVAQUIN) 500 MG tablet, DISCONTINUED: levofloxacin (LEVAQUIN) 500 MG tablet  Pneumonia - Plan: levofloxacin (LEVAQUIN) 500 MG  tablet, DISCONTINUED: levofloxacin (LEVAQUIN) 500 MG tablet  Lysbeth Penner FNP

## 2013-11-27 NOTE — Patient Instructions (Signed)

## 2013-11-29 ENCOUNTER — Encounter: Payer: Self-pay | Admitting: Family Medicine

## 2013-11-29 ENCOUNTER — Ambulatory Visit (INDEPENDENT_AMBULATORY_CARE_PROVIDER_SITE_OTHER): Payer: Medicare Other | Admitting: Family Medicine

## 2013-11-29 VITALS — BP 117/76 | HR 92 | Temp 98.4°F | Wt 205.8 lb

## 2013-11-29 DIAGNOSIS — J209 Acute bronchitis, unspecified: Secondary | ICD-10-CM

## 2013-11-29 NOTE — Progress Notes (Signed)
   Subjective:    Patient ID: Jeremy Vargas, male    DOB: 02-Apr-1930, 80 y.o.   MRN: [id number]  HPI This 80 y.o. male presents for evaluation of follow up on pneumonia.  CXR was read By radiology and doesn't show pneumonia and normal results noted.  Labs cbc is normal as well. He is not ambulating as well according to his wife.  He has been having some skin break down Over his first toes due to halux valgus.  He is seeing podiatry.  He is in wheelchair.   Review of Systems   No chest pain, SOB, HA, dizziness, vision change, N/V, diarrhea, constipation, dysuria, urinary urgency or frequency, myalgias, arthralgias or rash.  Objective:   Physical Exam Vital signs noted  Well developed well nourished male.  HEENT - Head atraumatic Normocephalic                Eyes - PERRLA, Conjuctiva - clear Sclera- Clear EOMI                Ears - EAC's Wnl TM's Wnl Gross Hearing WNL                Nose - Nares patent                 Throat - oropharanx wnl Respiratory - Lungs CTA bilateral Cardiac - RRR S1 and S2 without murmur GI - Abdomen soft Nontender and bowel sounds active x 4 Extremities - No edema. Neuro - Grossly intact.       Assessment & Plan:  Acute bronchitis Continue Levaquin and discussed activity as tolerated.  Discussed xray results and labs.  Lysbeth Penner FNP

## 2013-11-29 NOTE — Patient Instructions (Signed)

## 2013-12-21 DIAGNOSIS — L97509 Non-pressure chronic ulcer of other part of unspecified foot with unspecified severity: Secondary | ICD-10-CM | POA: Diagnosis not present

## 2013-12-21 DIAGNOSIS — B351 Tinea unguium: Secondary | ICD-10-CM | POA: Diagnosis not present

## 2013-12-21 DIAGNOSIS — L851 Acquired keratosis [keratoderma] palmaris et plantaris: Secondary | ICD-10-CM | POA: Diagnosis not present

## 2013-12-21 DIAGNOSIS — E1149 Type 2 diabetes mellitus with other diabetic neurological complication: Secondary | ICD-10-CM | POA: Diagnosis not present

## 2014-01-27 DIAGNOSIS — L97509 Non-pressure chronic ulcer of other part of unspecified foot with unspecified severity: Secondary | ICD-10-CM | POA: Diagnosis not present

## 2014-03-03 DIAGNOSIS — E1149 Type 2 diabetes mellitus with other diabetic neurological complication: Secondary | ICD-10-CM | POA: Diagnosis not present

## 2014-03-03 DIAGNOSIS — B351 Tinea unguium: Secondary | ICD-10-CM | POA: Diagnosis not present

## 2014-03-03 DIAGNOSIS — L851 Acquired keratosis [keratoderma] palmaris et plantaris: Secondary | ICD-10-CM | POA: Diagnosis not present

## 2014-03-14 ENCOUNTER — Telehealth: Payer: Self-pay | Admitting: Family Medicine

## 2014-03-15 NOTE — Telephone Encounter (Signed)
Patient is actually due for 6 month f/u on diabetes. Last visit was 08/2013.  Patient really just wanted A1C but I explained that an office visit at least every 6 months if necessary for diabetic patients. He reluctantly made an appt for the end of the week.

## 2014-03-15 NOTE — Telephone Encounter (Signed)
Please call and tell him he can come in and be seen

## 2014-03-18 ENCOUNTER — Ambulatory Visit (INDEPENDENT_AMBULATORY_CARE_PROVIDER_SITE_OTHER): Payer: Medicare Other | Admitting: Family Medicine

## 2014-03-18 ENCOUNTER — Encounter: Payer: Self-pay | Admitting: Family Medicine

## 2014-03-18 VITALS — BP 159/90 | HR 50 | Temp 97.8°F | Ht 70.0 in | Wt 211.4 lb

## 2014-03-18 DIAGNOSIS — E119 Type 2 diabetes mellitus without complications: Secondary | ICD-10-CM

## 2014-03-18 NOTE — Progress Notes (Signed)
Subjective:    Patient ID: Jeremy Vargas, male    DOB: [birth year], 80 y.o.   MRN: [id number]  HPI This 80 y.o. male presents for evaluation of diabetes.  Patient is beligerent and upset that he has to come in and be seen for his diabetes visit.  He calls the nurse an idiot and this is overheard by this provider along with his beligerant attitude.  He states he wants a standing hgbaic.  He states "This doesn't make any damn sense."  "You just want more money out of me."  He states he doesn't want to be seen for his diabetes and he feels like he is being ripped off. "All you want is money and you don't care about me."  He states his wife takes care of him and he just wants to come in and get his FSBS checked.  He is asked to remove his shoes and he states his foot doctor takes care of his feet.  He Is wondering why he didn't get his diabetes checked and why he didn't get his hgbaic at his acute appointment in January.  I ask him to please stop cussing and he states "What?" "All I said was damn." He states his wife takes care of his diabetes and she is an Therapist, sports. He states his foot doctor takes care of  His feet and he doesn't need to have a foot exam here at the office.  I ask him about his legs and feet and he states he has peripheral neuropathy.  I ask him how bad is his neuropathy and he states "I would like you to define what bad means."  I ask him if it is mild, moderate,or severe and he states he doesn't know.  He states he will not come in for routine diabetes appointments and all he needs is to have a hgbaic done.   Review of Systems C/o numbness in his lower extremities. No chest pain, SOB, HA, dizziness, vision change, N/V, diarrhea, constipation, dysuria, urinary urgency or frequency, myalgias, arthralgias or rash.     Objective:   Physical Exam  Vital signs noted  Well developed well nourished anxious and angry male.  HEENT - Head atraumatic Normocephalic                Eyes - PERRLA,  Conjuctiva - clear Sclera- Clear EOMI Respiratory - Lungs CTA bilateral Cardiac - RRR S1 and S2 without murmur GI - Abdomen soft Nontender and bowel sounds active x 4 Extremities - No edema. Neuro - Grossly intact. Feet - Bilateral Halux Valgus no sensation to monofilament.  Right bottom foot with callous. He has hammer toes and has dressing on some toes.     Assessment & Plan:  Diabetes I go over his labs that he were drawn in the past and this includes the lipid test he had last year which Shows LDL of 112.  I explain that diabetics are usually tx with statin meds to reduce LDL below 100 and that he needs lipid test today, cmp, cbc, urine for microalbumin, and hgbaic.  He states "Ok mr. I want to take care of your diabetes and your hyperlipidemia."  He is asked to call me by my name Mr. Garrett Bowring.  I explain that he needs to be seen and evaluated every 4 months for diabetes appointment. He refuses and states that is not going to happen and I need to order a hgbaic.  He is refusing to  Go along with  recommended guide lines for tx of diabetes and refuses to come in for visits he states. He is argumentive and abusive.  I inform him that I am not comfortable in being his PCP and would like him to talk with our administrator and I leave the room.  The administrator talks with him and he states he will not come in and be seen for diabetes visit every 4 months.  He leaves the office.  Lysbeth Penner FNP

## 2014-03-22 ENCOUNTER — Encounter: Payer: Self-pay | Admitting: Family Medicine

## 2014-03-28 ENCOUNTER — Ambulatory Visit: Payer: Medicare Other | Admitting: Family Medicine

## 2014-03-31 DIAGNOSIS — M25559 Pain in unspecified hip: Secondary | ICD-10-CM | POA: Diagnosis not present

## 2014-05-03 DIAGNOSIS — E1149 Type 2 diabetes mellitus with other diabetic neurological complication: Secondary | ICD-10-CM | POA: Diagnosis not present

## 2014-05-03 DIAGNOSIS — L851 Acquired keratosis [keratoderma] palmaris et plantaris: Secondary | ICD-10-CM | POA: Diagnosis not present

## 2014-05-03 DIAGNOSIS — B351 Tinea unguium: Secondary | ICD-10-CM | POA: Diagnosis not present

## 2014-05-24 DIAGNOSIS — L97509 Non-pressure chronic ulcer of other part of unspecified foot with unspecified severity: Secondary | ICD-10-CM | POA: Diagnosis not present

## 2014-06-08 DIAGNOSIS — J40 Bronchitis, not specified as acute or chronic: Secondary | ICD-10-CM | POA: Diagnosis not present

## 2014-06-14 DIAGNOSIS — L97509 Non-pressure chronic ulcer of other part of unspecified foot with unspecified severity: Secondary | ICD-10-CM | POA: Diagnosis not present

## 2014-06-28 ENCOUNTER — Other Ambulatory Visit: Payer: Self-pay | Admitting: *Deleted

## 2014-06-28 MED ORDER — DILTIAZEM HCL 120 MG PO TABS: 120.0000 mg | ORAL_TABLET | Freq: Every day | ORAL | Status: DC

## 2014-07-07 DIAGNOSIS — L97509 Non-pressure chronic ulcer of other part of unspecified foot with unspecified severity: Secondary | ICD-10-CM | POA: Diagnosis not present

## 2014-07-08 DIAGNOSIS — M545 Low back pain, unspecified: Secondary | ICD-10-CM | POA: Diagnosis not present

## 2014-07-08 DIAGNOSIS — M47817 Spondylosis without myelopathy or radiculopathy, lumbosacral region: Secondary | ICD-10-CM | POA: Diagnosis not present

## 2014-07-12 DIAGNOSIS — M47817 Spondylosis without myelopathy or radiculopathy, lumbosacral region: Secondary | ICD-10-CM | POA: Diagnosis not present

## 2014-07-12 DIAGNOSIS — S335XXA Sprain of ligaments of lumbar spine, initial encounter: Secondary | ICD-10-CM | POA: Diagnosis not present

## 2014-07-12 DIAGNOSIS — M999 Biomechanical lesion, unspecified: Secondary | ICD-10-CM | POA: Diagnosis not present

## 2014-07-13 DIAGNOSIS — M47817 Spondylosis without myelopathy or radiculopathy, lumbosacral region: Secondary | ICD-10-CM | POA: Diagnosis not present

## 2014-07-13 DIAGNOSIS — M999 Biomechanical lesion, unspecified: Secondary | ICD-10-CM | POA: Diagnosis not present

## 2014-07-13 DIAGNOSIS — S335XXA Sprain of ligaments of lumbar spine, initial encounter: Secondary | ICD-10-CM | POA: Diagnosis not present

## 2014-07-15 DIAGNOSIS — M47817 Spondylosis without myelopathy or radiculopathy, lumbosacral region: Secondary | ICD-10-CM | POA: Diagnosis not present

## 2014-07-15 DIAGNOSIS — S335XXA Sprain of ligaments of lumbar spine, initial encounter: Secondary | ICD-10-CM | POA: Diagnosis not present

## 2014-07-15 DIAGNOSIS — M999 Biomechanical lesion, unspecified: Secondary | ICD-10-CM | POA: Diagnosis not present

## 2014-07-20 DIAGNOSIS — IMO0002 Reserved for concepts with insufficient information to code with codable children: Secondary | ICD-10-CM | POA: Diagnosis not present

## 2014-07-20 DIAGNOSIS — F17200 Nicotine dependence, unspecified, uncomplicated: Secondary | ICD-10-CM | POA: Diagnosis not present

## 2014-07-20 DIAGNOSIS — S22009A Unspecified fracture of unspecified thoracic vertebra, initial encounter for closed fracture: Secondary | ICD-10-CM | POA: Diagnosis not present

## 2014-07-20 DIAGNOSIS — M8448XA Pathological fracture, other site, initial encounter for fracture: Secondary | ICD-10-CM | POA: Diagnosis not present

## 2014-07-20 DIAGNOSIS — Z8589 Personal history of malignant neoplasm of other organs and systems: Secondary | ICD-10-CM | POA: Diagnosis not present

## 2014-07-20 DIAGNOSIS — J449 Chronic obstructive pulmonary disease, unspecified: Secondary | ICD-10-CM | POA: Diagnosis not present

## 2014-07-20 DIAGNOSIS — M545 Low back pain, unspecified: Secondary | ICD-10-CM | POA: Diagnosis not present

## 2014-07-20 DIAGNOSIS — E119 Type 2 diabetes mellitus without complications: Secondary | ICD-10-CM | POA: Diagnosis not present

## 2014-07-20 DIAGNOSIS — S32009A Unspecified fracture of unspecified lumbar vertebra, initial encounter for closed fracture: Secondary | ICD-10-CM | POA: Diagnosis not present

## 2014-07-20 DIAGNOSIS — Z79899 Other long term (current) drug therapy: Secondary | ICD-10-CM | POA: Diagnosis not present

## 2014-07-20 DIAGNOSIS — Z87891 Personal history of nicotine dependence: Secondary | ICD-10-CM | POA: Diagnosis not present

## 2014-07-20 DIAGNOSIS — R52 Pain, unspecified: Secondary | ICD-10-CM | POA: Diagnosis not present

## 2014-07-20 DIAGNOSIS — G609 Hereditary and idiopathic neuropathy, unspecified: Secondary | ICD-10-CM | POA: Diagnosis not present

## 2014-07-20 DIAGNOSIS — W19XXXA Unspecified fall, initial encounter: Secondary | ICD-10-CM | POA: Diagnosis not present

## 2014-07-20 DIAGNOSIS — M549 Dorsalgia, unspecified: Secondary | ICD-10-CM | POA: Diagnosis not present

## 2014-07-21 DIAGNOSIS — S32009A Unspecified fracture of unspecified lumbar vertebra, initial encounter for closed fracture: Secondary | ICD-10-CM | POA: Diagnosis not present

## 2014-07-22 DIAGNOSIS — S32009A Unspecified fracture of unspecified lumbar vertebra, initial encounter for closed fracture: Secondary | ICD-10-CM | POA: Diagnosis not present

## 2014-07-22 DIAGNOSIS — S22009A Unspecified fracture of unspecified thoracic vertebra, initial encounter for closed fracture: Secondary | ICD-10-CM | POA: Diagnosis not present

## 2014-07-22 DIAGNOSIS — Z8589 Personal history of malignant neoplasm of other organs and systems: Secondary | ICD-10-CM | POA: Diagnosis not present

## 2014-07-22 DIAGNOSIS — Z79899 Other long term (current) drug therapy: Secondary | ICD-10-CM | POA: Diagnosis not present

## 2014-07-22 DIAGNOSIS — Z87891 Personal history of nicotine dependence: Secondary | ICD-10-CM | POA: Diagnosis not present

## 2014-07-22 DIAGNOSIS — E119 Type 2 diabetes mellitus without complications: Secondary | ICD-10-CM | POA: Diagnosis not present

## 2014-07-22 DIAGNOSIS — G609 Hereditary and idiopathic neuropathy, unspecified: Secondary | ICD-10-CM | POA: Diagnosis not present

## 2014-07-22 DIAGNOSIS — M8448XA Pathological fracture, other site, initial encounter for fracture: Secondary | ICD-10-CM | POA: Diagnosis not present

## 2014-07-23 DIAGNOSIS — S32009A Unspecified fracture of unspecified lumbar vertebra, initial encounter for closed fracture: Secondary | ICD-10-CM | POA: Diagnosis not present

## 2014-07-27 DIAGNOSIS — M549 Dorsalgia, unspecified: Secondary | ICD-10-CM | POA: Diagnosis not present

## 2014-07-27 DIAGNOSIS — I10 Essential (primary) hypertension: Secondary | ICD-10-CM | POA: Diagnosis not present

## 2014-07-27 DIAGNOSIS — IMO0001 Reserved for inherently not codable concepts without codable children: Secondary | ICD-10-CM | POA: Diagnosis not present

## 2014-07-27 DIAGNOSIS — R5381 Other malaise: Secondary | ICD-10-CM | POA: Diagnosis not present

## 2014-07-27 DIAGNOSIS — I4891 Unspecified atrial fibrillation: Secondary | ICD-10-CM | POA: Diagnosis not present

## 2014-07-27 DIAGNOSIS — R5383 Other fatigue: Secondary | ICD-10-CM | POA: Diagnosis not present

## 2014-07-27 DIAGNOSIS — I679 Cerebrovascular disease, unspecified: Secondary | ICD-10-CM | POA: Diagnosis not present

## 2014-07-28 DIAGNOSIS — L97509 Non-pressure chronic ulcer of other part of unspecified foot with unspecified severity: Secondary | ICD-10-CM | POA: Diagnosis not present

## 2014-07-29 DIAGNOSIS — Z4789 Encounter for other orthopedic aftercare: Secondary | ICD-10-CM | POA: Diagnosis not present

## 2014-07-29 DIAGNOSIS — M8448XA Pathological fracture, other site, initial encounter for fracture: Secondary | ICD-10-CM | POA: Diagnosis not present

## 2014-08-03 DIAGNOSIS — I4891 Unspecified atrial fibrillation: Secondary | ICD-10-CM | POA: Diagnosis not present

## 2014-08-09 DIAGNOSIS — M810 Age-related osteoporosis without current pathological fracture: Secondary | ICD-10-CM | POA: Diagnosis not present

## 2014-08-09 DIAGNOSIS — Z79899 Other long term (current) drug therapy: Secondary | ICD-10-CM | POA: Diagnosis not present

## 2014-08-19 DIAGNOSIS — Z7901 Long term (current) use of anticoagulants: Secondary | ICD-10-CM | POA: Diagnosis not present

## 2014-08-21 ENCOUNTER — Other Ambulatory Visit: Payer: Self-pay | Admitting: Cardiology

## 2014-08-29 DIAGNOSIS — E118 Type 2 diabetes mellitus with unspecified complications: Secondary | ICD-10-CM | POA: Diagnosis not present

## 2014-08-29 DIAGNOSIS — S22080A Wedge compression fracture of T11-T12 vertebra, initial encounter for closed fracture: Secondary | ICD-10-CM | POA: Diagnosis not present

## 2014-08-29 DIAGNOSIS — M549 Dorsalgia, unspecified: Secondary | ICD-10-CM | POA: Diagnosis not present

## 2014-08-29 DIAGNOSIS — Z8262 Family history of osteoporosis: Secondary | ICD-10-CM | POA: Diagnosis not present

## 2014-08-29 DIAGNOSIS — Z87891 Personal history of nicotine dependence: Secondary | ICD-10-CM | POA: Diagnosis not present

## 2014-08-29 DIAGNOSIS — Z9889 Other specified postprocedural states: Secondary | ICD-10-CM | POA: Diagnosis not present

## 2014-08-29 DIAGNOSIS — M810 Age-related osteoporosis without current pathological fracture: Secondary | ICD-10-CM | POA: Diagnosis not present

## 2014-08-29 DIAGNOSIS — M818 Other osteoporosis without current pathological fracture: Secondary | ICD-10-CM | POA: Diagnosis not present

## 2014-09-08 ENCOUNTER — Encounter: Payer: Self-pay | Admitting: *Deleted

## 2014-09-14 ENCOUNTER — Encounter: Payer: Self-pay | Admitting: Cardiology

## 2014-09-14 ENCOUNTER — Ambulatory Visit (INDEPENDENT_AMBULATORY_CARE_PROVIDER_SITE_OTHER): Payer: Medicare Other | Admitting: Cardiology

## 2014-09-14 VITALS — BP 145/96 | HR 110 | Ht 68.0 in | Wt 203.0 lb

## 2014-09-14 DIAGNOSIS — I480 Paroxysmal atrial fibrillation: Secondary | ICD-10-CM

## 2014-09-14 MED ORDER — DILTIAZEM HCL ER COATED BEADS 240 MG PO CP24: ORAL_CAPSULE | ORAL | Status: DC

## 2014-09-14 MED ORDER — APIXABAN 2.5 MG PO TABS
2.5000 mg | ORAL_TABLET | Freq: Two times a day (BID) | ORAL | Status: DC
Start: 2014-09-14 — End: 2014-10-26

## 2014-09-14 NOTE — Patient Instructions (Addendum)
Please increase Cardizem to 240 mg a day. Start Eliquis 2.5 mg one tablet twice a day. Stop Flecainide.  Continue all other medications as listed.  Follow up in 1 month with Dr Percival Spanish in [place].

## 2014-09-14 NOTE — Progress Notes (Signed)
HPI The patient presents for evaluation of atrial fibrillation. He is moved here from [place]. He reports having SVT ablated twice around 2010. He had atrial fibrillation following this and had cardioversion twice.   He was in sinus rhythm when I first met him. However, sometime several weeks ago he went back into fibrillation. His wife noticed his heart was irregular. She is a Marine scientist. He was noted apparently to have an irregular rhythm when he saw his new doctor.  He was started on warfarin. However, he was having trouble getting this checked so he stopped it. On his own he increased his flecainide dose. However, he did not apparently convert back to sinus. He's been having a lot of back problems. However, he's not really noticing his heart. He denies any palpitations, presyncope or syncope. He's not been having any chest pressure, neck or arm discomfort. He's having no weight gain or edema.  Allergies  Allergen Reactions  . Betadine [Povidone Iodine]     Current Outpatient Prescriptions  Medication Sig Dispense Refill  . diltiazem (CARDIZEM CD) 120 MG 24 hr capsule TAKE ONE (1) CAPSULE EACH DAY  30 capsule  0  . flecainide (TAMBOCOR) 100 MG tablet Take 1 tablet (100 mg total) by mouth 2 (two) times daily.  180 tablet  3  . metFORMIN (GLUCOPHAGE) 1000 MG tablet Take 1,000 mg by mouth 2 (two) times daily with a meal.       No current facility-administered medications for this visit.    Past Medical History  Diagnosis Date  . Diabetes mellitus without complication   . Other and unspecified hyperlipidemia   . Unspecified essential hypertension   . Cardiac dysrhythmia, unspecified   . Atrial fibrillation   . SVT (supraventricular tachycardia)   . Compression fracture of T12 vertebra   . Vitamin D deficiency   . Compression fracture of L4 lumbar vertebra     Past Surgical History  Procedure Laterality Date  . Ablation      x2  . Cardioversion      x2  . Skin cancer excision    .  Hiatal hernia repair      x2    ROS:  Positive for back pain. Otherwise as stated in the HPI and negative for all other systems.  PHYSICAL EXAM BP 145/96  Pulse 110  Ht 5' 8"  (1.727 m)  Wt 203 lb (92.08 kg)  BMI 30.87 kg/m2 GEN:  No distress NECK:  No jugular venous distention at 90 degrees, waveform within normal limits, carotid upstroke brisk and symmetric, no bruits, no thyromegaly LYMPHATICS:  No cervical adenopathy LUNGS:  Clear to auscultation bilaterally BACK:  No CVA tenderness CHEST:  Unremarkable HEART:  S1 and S2 within normal limits, no S3, no clicks, no rubs, no murmurs, irregulars ABD:  Positive bowel sounds normal in frequency in pitch, no bruits, no rebound, no guarding, unable to assess midline mass or bruit with the patient seated. EXT:  2 plus pulses throughout, moderate edema, no cyanosis no clubbing SKIN:  No rashes no nodules NEURO:  Cranial nerves II through XII grossly intact, motor grossly intact throughout PSYCH:  Cognitively intact, oriented to person place and time    EKG:  Atrial fib, rate 110, left axis deviation, left anterior fascicular block, poor anterior R wave progression, no acute ST-T wave changes. 09/14/2014  ASSESSMENT AND PLAN  Atrial fibrillation -   He is now in atrial fibrillation..   [name] has a CHA2DS2 - VASc  score of 4 with a risk of stroke of 4% .  Given this anticoagulation is indicated. He couldn't take warfarin because he couldn't get the appointments. I will stop flecainide as it is not keeping him in sinus rhythm. I will increase his Cardizem to 240 mg daily as his rate is slightly elevated. His age is about 44. His creatinine is 1.56 with a creatinine clearance calculated at 47.6. He will be started on helpless 2.5 mg twice a day. He understands the risks benefits of anticoagulation. He will not be taking aspirin. We had a long discussion about this.  SVT -  He has had no recurrence of this. No change in therapy is  indicated.  HTN -  He reports that he no longer has this.he will continue the meds as listed.  And blood pressure elevation today is an aberration. His wife is a retired Marine scientist and check on this.

## 2014-09-27 DIAGNOSIS — L97511 Non-pressure chronic ulcer of other part of right foot limited to breakdown of skin: Secondary | ICD-10-CM | POA: Diagnosis not present

## 2014-10-25 DIAGNOSIS — J309 Allergic rhinitis, unspecified: Secondary | ICD-10-CM | POA: Diagnosis not present

## 2014-10-25 DIAGNOSIS — Z0000 Encounter for general adult medical examination without abnormal findings: Secondary | ICD-10-CM | POA: Diagnosis not present

## 2014-10-25 DIAGNOSIS — E119 Type 2 diabetes mellitus without complications: Secondary | ICD-10-CM | POA: Diagnosis not present

## 2014-10-26 ENCOUNTER — Ambulatory Visit (INDEPENDENT_AMBULATORY_CARE_PROVIDER_SITE_OTHER): Payer: Medicare Other | Admitting: *Deleted

## 2014-10-26 ENCOUNTER — Ambulatory Visit (INDEPENDENT_AMBULATORY_CARE_PROVIDER_SITE_OTHER): Payer: Medicare Other | Admitting: Cardiology

## 2014-10-26 ENCOUNTER — Encounter: Payer: Self-pay | Admitting: Cardiology

## 2014-10-26 VITALS — BP 147/82 | HR 74 | Ht 67.5 in | Wt 208.0 lb

## 2014-10-26 DIAGNOSIS — E782 Mixed hyperlipidemia: Secondary | ICD-10-CM | POA: Diagnosis not present

## 2014-10-26 DIAGNOSIS — I481 Persistent atrial fibrillation: Secondary | ICD-10-CM | POA: Diagnosis not present

## 2014-10-26 DIAGNOSIS — I4819 Other persistent atrial fibrillation: Secondary | ICD-10-CM

## 2014-10-26 DIAGNOSIS — Z7901 Long term (current) use of anticoagulants: Secondary | ICD-10-CM | POA: Diagnosis not present

## 2014-10-26 DIAGNOSIS — E119 Type 2 diabetes mellitus without complications: Secondary | ICD-10-CM | POA: Diagnosis not present

## 2014-10-26 NOTE — Progress Notes (Signed)
HPI The patient presents for evaluation of atrial fibrillation. He has had a history of SVT ablated twice around 2010 when he lived in the [place]. He had atrial fibrillation following this and had cardioversion twice.  He is now in persistent atrial fib.  However, he doesn't really have any symptoms related to this. He doesn't notice any palpitations, presyncope or syncope. He is tolerating anticoagulation. I put him on Eliquis.  However, he was unable pay  for this. He wasn't on warfarin previously because he wouldn't be compliant with Coumadin checks. However, he is now back on this and says he is complying. He's tolerating this medication. He's just had kyphoplasty and seems to feel that his back pain is better . He denies any falls. He's had no chest pressure, neck or arm discomfort. He's had no PND or orthopnea.   Allergies  Allergen Reactions  . Betadine [Povidone Iodine]     Current Outpatient Prescriptions  Medication Sig Dispense Refill  . diltiazem (CARDIZEM CD) 240 MG 24 hr capsule TAKE ONE (1) CAPSULE EACH DAY 30 capsule 11  . metFORMIN (GLUCOPHAGE) 1000 MG tablet Take 1,000 mg by mouth 2 (two) times daily with a meal.    . warfarin (COUMADIN) 5 MG tablet Take 5 mg by mouth daily.     No current facility-administered medications for this visit.    Past Medical History  Diagnosis Date  . Diabetes mellitus without complication   . Other and unspecified hyperlipidemia   . Unspecified essential hypertension   . Cardiac dysrhythmia, unspecified   . Atrial fibrillation   . SVT (supraventricular tachycardia)   . Compression fracture of T12 vertebra   . Vitamin D deficiency   . Compression fracture of L4 lumbar vertebra     Past Surgical History  Procedure Laterality Date  . Ablation      x2  . Cardioversion      x2  . Skin cancer excision    . Hiatal hernia repair      x2    ROS:  Positive for back pain. Otherwise as stated in the HPI and negative for all other  systems.  PHYSICAL EXAM BP 147/82 mmHg  Pulse 74  Ht 5' 7.5" (1.715 m)  Wt 208 lb (94.348 kg)  BMI 32.08 kg/m2 GENERAL:  Well appearing HEENT:  Pupils equal round and reactive, fundi not visualized, oral mucosa unremarkable NECK:  No jugular venous distention, waveform within normal limits, carotid upstroke brisk and symmetric, no bruits, no thyromegaly LYMPHATICS:  No cervical, inguinal adenopathy LUNGS:  Clear to auscultation bilaterally BACK:  No CVA tenderness CHEST:  Unremarkable HEART:  PMI not displaced or sustained,S1 and S2 within normal limits, no S3, no clicks, no rubs, no murmurs, irregular ABD:  Flat, positive bowel sounds normal in frequency in pitch, no bruits, no rebound, no guarding, no midline pulsatile mass, no hepatomegaly, no splenomegaly EXT:  2 plus pulses throughout, mild ankle edema, no cyanosis no clubbing SKIN:  No rashes no nodules PSYCH:  Cognitively intact, oriented to person place and time   ASSESSMENT AND PLAN  Atrial fibrillation -   The patient  tolerates this rhythm and rate control and anticoagulation. We will continue with the meds as listed.   Mr. Eliza Grissinger has a CHA2DS2 - VASc score of 4 with a risk of stroke of 4% .  He could not afford NOAC and so he will continue with warfarin.  I will order a 24 hour Holter to make sure  that his rate is OK.    SVT -  He has had no recurrence of this. No change in therapy is indicated.  HTN - The blood pressure is at target. No change in medications is indicated. We will continue with therapeutic lifestyle changes (TLC).

## 2014-10-26 NOTE — Patient Instructions (Signed)
The current medical regimen is effective;  continue present plan and medications.  Your physician has recommended that you wear a holter monitor. Holter monitors are medical devices that record the heart's electrical activity. Doctors most often use these monitors to diagnose arrhythmias. Arrhythmias are problems with the speed or rhythm of the heartbeat. The monitor is a small, portable device. You can wear one while you do your normal daily activities. This is usually used to diagnose what is causing palpitations/syncope (passing out).  Follow up in 6 months with Dr Hochrein.  You will receive a letter in the mail 2 months before you are due.  Please call us when you receive this letter to schedule your follow up appointment.  

## 2014-10-26 NOTE — Progress Notes (Signed)
Patient came in today for a 24 hour holter monitor per Dr. Percival Spanish. Holter Monitor placed and will return in the am.

## 2014-10-27 DIAGNOSIS — L97511 Non-pressure chronic ulcer of other part of right foot limited to breakdown of skin: Secondary | ICD-10-CM | POA: Diagnosis not present

## 2014-10-28 NOTE — Progress Notes (Signed)
24 hr holter monitor downloaded and sent to [facility].

## 2014-11-01 DIAGNOSIS — I4891 Unspecified atrial fibrillation: Secondary | ICD-10-CM | POA: Diagnosis not present

## 2014-11-03 DIAGNOSIS — L570 Actinic keratosis: Secondary | ICD-10-CM | POA: Diagnosis not present

## 2014-11-07 ENCOUNTER — Encounter: Payer: Self-pay | Admitting: Family Medicine

## 2014-11-10 NOTE — Telephone Encounter (Signed)
Tried to call patient to address his concerns but the number we have is disconnected. I also noticed that patient is dismissed from our practice. The patient's email will be shared with Dr. Benay Pillow who is VP and Executive Medical Director, [place]

## 2014-11-23 DIAGNOSIS — E299 Testicular dysfunction, unspecified: Secondary | ICD-10-CM | POA: Diagnosis not present

## 2014-11-23 DIAGNOSIS — Z7901 Long term (current) use of anticoagulants: Secondary | ICD-10-CM | POA: Diagnosis not present

## 2014-12-01 DIAGNOSIS — L97511 Non-pressure chronic ulcer of other part of right foot limited to breakdown of skin: Secondary | ICD-10-CM | POA: Diagnosis not present

## 2014-12-14 DIAGNOSIS — Z7901 Long term (current) use of anticoagulants: Secondary | ICD-10-CM | POA: Diagnosis not present

## 2014-12-15 DIAGNOSIS — L97511 Non-pressure chronic ulcer of other part of right foot limited to breakdown of skin: Secondary | ICD-10-CM | POA: Diagnosis not present

## 2014-12-29 DIAGNOSIS — M86171 Other acute osteomyelitis, right ankle and foot: Secondary | ICD-10-CM | POA: Diagnosis not present

## 2014-12-29 DIAGNOSIS — E119 Type 2 diabetes mellitus without complications: Secondary | ICD-10-CM | POA: Diagnosis not present

## 2014-12-29 DIAGNOSIS — L02611 Cutaneous abscess of right foot: Secondary | ICD-10-CM | POA: Diagnosis not present

## 2015-01-05 DIAGNOSIS — I4891 Unspecified atrial fibrillation: Secondary | ICD-10-CM | POA: Diagnosis not present

## 2015-01-05 DIAGNOSIS — Z888 Allergy status to other drugs, medicaments and biological substances status: Secondary | ICD-10-CM | POA: Diagnosis not present

## 2015-01-05 DIAGNOSIS — E119 Type 2 diabetes mellitus without complications: Secondary | ICD-10-CM | POA: Diagnosis not present

## 2015-01-05 DIAGNOSIS — Z7901 Long term (current) use of anticoagulants: Secondary | ICD-10-CM | POA: Diagnosis not present

## 2015-01-05 DIAGNOSIS — M86271 Subacute osteomyelitis, right ankle and foot: Secondary | ICD-10-CM | POA: Diagnosis not present

## 2015-01-05 DIAGNOSIS — Z79899 Other long term (current) drug therapy: Secondary | ICD-10-CM | POA: Diagnosis not present

## 2015-01-05 DIAGNOSIS — I10 Essential (primary) hypertension: Secondary | ICD-10-CM | POA: Diagnosis not present

## 2015-01-05 DIAGNOSIS — M868X7 Other osteomyelitis, ankle and foot: Secondary | ICD-10-CM | POA: Diagnosis not present

## 2015-01-05 DIAGNOSIS — M86171 Other acute osteomyelitis, right ankle and foot: Secondary | ICD-10-CM | POA: Diagnosis not present

## 2015-01-17 DIAGNOSIS — S32000A Wedge compression fracture of unspecified lumbar vertebra, initial encounter for closed fracture: Secondary | ICD-10-CM | POA: Diagnosis not present

## 2015-01-17 DIAGNOSIS — M8448XA Pathological fracture, other site, initial encounter for fracture: Secondary | ICD-10-CM | POA: Diagnosis not present

## 2015-01-17 DIAGNOSIS — S32009A Unspecified fracture of unspecified lumbar vertebra, initial encounter for closed fracture: Secondary | ICD-10-CM | POA: Diagnosis not present

## 2015-01-18 DIAGNOSIS — Z7901 Long term (current) use of anticoagulants: Secondary | ICD-10-CM | POA: Diagnosis not present

## 2015-01-26 DIAGNOSIS — E1165 Type 2 diabetes mellitus with hyperglycemia: Secondary | ICD-10-CM | POA: Diagnosis not present

## 2015-01-26 DIAGNOSIS — J329 Chronic sinusitis, unspecified: Secondary | ICD-10-CM | POA: Diagnosis not present

## 2015-01-31 DIAGNOSIS — M869 Osteomyelitis, unspecified: Secondary | ICD-10-CM | POA: Diagnosis not present

## 2015-02-08 DIAGNOSIS — C44319 Basal cell carcinoma of skin of other parts of face: Secondary | ICD-10-CM | POA: Diagnosis not present

## 2015-02-08 DIAGNOSIS — L859 Epidermal thickening, unspecified: Secondary | ICD-10-CM | POA: Diagnosis not present

## 2015-02-08 DIAGNOSIS — C44329 Squamous cell carcinoma of skin of other parts of face: Secondary | ICD-10-CM | POA: Diagnosis not present

## 2015-02-08 DIAGNOSIS — D0439 Carcinoma in situ of skin of other parts of face: Secondary | ICD-10-CM | POA: Diagnosis not present

## 2015-02-08 DIAGNOSIS — L570 Actinic keratosis: Secondary | ICD-10-CM | POA: Diagnosis not present

## 2015-02-08 DIAGNOSIS — D485 Neoplasm of uncertain behavior of skin: Secondary | ICD-10-CM | POA: Diagnosis not present

## 2015-02-08 DIAGNOSIS — Z85828 Personal history of other malignant neoplasm of skin: Secondary | ICD-10-CM | POA: Diagnosis not present

## 2015-02-16 DIAGNOSIS — Z7901 Long term (current) use of anticoagulants: Secondary | ICD-10-CM | POA: Diagnosis not present

## 2015-02-16 DIAGNOSIS — E1165 Type 2 diabetes mellitus with hyperglycemia: Secondary | ICD-10-CM | POA: Diagnosis not present

## 2015-02-23 DIAGNOSIS — C44320 Squamous cell carcinoma of skin of unspecified parts of face: Secondary | ICD-10-CM | POA: Diagnosis not present

## 2015-02-23 DIAGNOSIS — C4442 Squamous cell carcinoma of skin of scalp and neck: Secondary | ICD-10-CM | POA: Diagnosis not present

## 2015-03-22 DIAGNOSIS — Z7901 Long term (current) use of anticoagulants: Secondary | ICD-10-CM | POA: Diagnosis not present

## 2015-04-05 ENCOUNTER — Other Ambulatory Visit: Payer: Self-pay | Admitting: Family Medicine

## 2015-04-12 DIAGNOSIS — Z7901 Long term (current) use of anticoagulants: Secondary | ICD-10-CM | POA: Diagnosis not present

## 2015-04-13 DIAGNOSIS — E1142 Type 2 diabetes mellitus with diabetic polyneuropathy: Secondary | ICD-10-CM | POA: Diagnosis not present

## 2015-04-13 DIAGNOSIS — B351 Tinea unguium: Secondary | ICD-10-CM | POA: Diagnosis not present

## 2015-04-13 DIAGNOSIS — L84 Corns and callosities: Secondary | ICD-10-CM | POA: Diagnosis not present

## 2015-04-21 DIAGNOSIS — L97511 Non-pressure chronic ulcer of other part of right foot limited to breakdown of skin: Secondary | ICD-10-CM | POA: Diagnosis not present

## 2015-04-26 ENCOUNTER — Encounter: Payer: Self-pay | Admitting: Cardiology

## 2015-04-26 ENCOUNTER — Ambulatory Visit (INDEPENDENT_AMBULATORY_CARE_PROVIDER_SITE_OTHER): Payer: Medicare Other | Admitting: Cardiology

## 2015-04-26 VITALS — BP 130/74 | HR 81 | Ht 69.0 in | Wt 208.0 lb

## 2015-04-26 DIAGNOSIS — I482 Chronic atrial fibrillation, unspecified: Secondary | ICD-10-CM

## 2015-04-26 NOTE — Progress Notes (Signed)
HPI The patient presents for evaluation of atrial fibrillation. He has had a history of SVT ablated twice around 2010 when he lived in the [place]. He had atrial fibrillation following this and had cardioversion twice.  He is now in persistent atrial fib.  After a previous visit I did get him wear a Holter monitor which demonstrated reasonable rate control. Since I last saw him he's had 2 amputated and more fractures in his back. He still does the household chores. With this he denies any cardiovascular symptoms. The patient denies any new symptoms such as chest discomfort, neck or arm discomfort. There has been no new shortness of breath, PND or orthopnea. There have been no reported palpitations, presyncope or syncope.  Allergies  Allergen Reactions  . Betadine [Povidone Iodine] Rash    Current Outpatient Prescriptions  Medication Sig Dispense Refill  . diltiazem (CARDIZEM CD) 240 MG 24 hr capsule TAKE ONE (1) CAPSULE EACH DAY 30 capsule 11  . metFORMIN (GLUCOPHAGE) 1000 MG tablet Take 1,000 mg by mouth daily with breakfast.     . warfarin (COUMADIN) 5 MG tablet Take 5 mg by mouth daily.     No current facility-administered medications for this visit.    Past Medical History  Diagnosis Date  . Diabetes mellitus without complication   . Other and unspecified hyperlipidemia   . Unspecified essential hypertension   . Cardiac dysrhythmia, unspecified   . Atrial fibrillation   . SVT (supraventricular tachycardia)   . Compression fracture of T12 vertebra   . Vitamin D deficiency   . Compression fracture of L4 lumbar vertebra     Past Surgical History  Procedure Laterality Date  . Ablation      x2  . Cardioversion      x2  . Skin cancer excision    . Hiatal hernia repair      x2  . Kyphoplasty      ROS:  Positive for back pain and phlegm. Otherwise as stated in the HPI and negative for all other systems.  PHYSICAL EXAM BP 130/74 mmHg  Pulse 81  Ht 5\' 9"  (1.753 m)  Wt  208 lb (94.348 kg)  BMI 30.70 kg/m2 GENERAL:  Well appearing HEENT:  Pupils equal round and reactive, fundi not visualized, oral mucosa unremarkable NECK:  No jugular venous distention, waveform within normal limits, carotid upstroke brisk and symmetric, no bruits, no thyromegaly LYMPHATICS:  No cervical, inguinal adenopathy LUNGS:  Clear to auscultation bilaterally BACK:  No CVA tenderness,  CHEST:  Unremarkable HEART:  PMI not displaced or sustained,S1 and S2 within normal limits, no S3, no clicks, no rubs, no murmurs, irregular ABD:  Flat, positive bowel sounds normal in frequency in pitch, no bruits, no rebound, no guarding, no midline pulsatile mass, no hepatomegaly, no splenomegaly EXT:  2 plus pulses throughout, mild ankle edema, no cyanosis no clubbing   EKG:  Atrial fibrillation, rate 81, axis left axis deviation, left anterior fascicular block, anterior R wave progression, no acute ST-T wave changes.   ASSESSMENT AND PLAN  Atrial fibrillation -   The patient  tolerates this rhythm and rate control and anticoagulation. We will continue with the meds as listed.   [name] has a CHA2DS2 - VASc score of 4 with a risk of stroke of 4% .  He could not afford NOAC and so he will continue with warfarin.  By his report and description he is compliant with follow-up.  SVT -  He has had  no recurrence of this. No change in therapy is indicated.  HTN - The blood pressure is at target. No change in medications is indicated. We will continue with therapeutic lifestyle changes (TLC).

## 2015-04-26 NOTE — Patient Instructions (Signed)
Medication Instructions:  Your physician recommends that you continue on your current medications as directed. Please refer to the Current Medication list given to you today.  Follow-Up: Follow up in 1 year with Dr. Hochrein.  You will receive a letter in the mail 2 months before you are due.  Please call us when you receive this letter to schedule your follow up appointment.   Thank you for choosing [facility]!!       

## 2015-04-27 DIAGNOSIS — L97511 Non-pressure chronic ulcer of other part of right foot limited to breakdown of skin: Secondary | ICD-10-CM | POA: Diagnosis not present

## 2015-05-09 DIAGNOSIS — L97521 Non-pressure chronic ulcer of other part of left foot limited to breakdown of skin: Secondary | ICD-10-CM | POA: Diagnosis not present

## 2015-05-17 DIAGNOSIS — Z7901 Long term (current) use of anticoagulants: Secondary | ICD-10-CM | POA: Diagnosis not present

## 2015-06-03 DIAGNOSIS — L03115 Cellulitis of right lower limb: Secondary | ICD-10-CM | POA: Diagnosis not present

## 2015-06-07 DIAGNOSIS — Z7901 Long term (current) use of anticoagulants: Secondary | ICD-10-CM | POA: Diagnosis not present

## 2015-06-15 DIAGNOSIS — E1142 Type 2 diabetes mellitus with diabetic polyneuropathy: Secondary | ICD-10-CM | POA: Diagnosis not present

## 2015-06-15 DIAGNOSIS — B351 Tinea unguium: Secondary | ICD-10-CM | POA: Diagnosis not present

## 2015-06-15 DIAGNOSIS — L84 Corns and callosities: Secondary | ICD-10-CM | POA: Diagnosis not present

## 2015-06-19 DIAGNOSIS — E1165 Type 2 diabetes mellitus with hyperglycemia: Secondary | ICD-10-CM | POA: Diagnosis not present

## 2015-06-19 DIAGNOSIS — E119 Type 2 diabetes mellitus without complications: Secondary | ICD-10-CM | POA: Diagnosis not present

## 2015-06-19 DIAGNOSIS — I10 Essential (primary) hypertension: Secondary | ICD-10-CM | POA: Diagnosis not present

## 2015-07-12 DIAGNOSIS — Z7901 Long term (current) use of anticoagulants: Secondary | ICD-10-CM | POA: Diagnosis not present

## 2015-07-17 DIAGNOSIS — H40033 Anatomical narrow angle, bilateral: Secondary | ICD-10-CM | POA: Diagnosis not present

## 2015-07-17 DIAGNOSIS — E119 Type 2 diabetes mellitus without complications: Secondary | ICD-10-CM | POA: Diagnosis not present

## 2015-07-18 DIAGNOSIS — S0501XA Injury of conjunctiva and corneal abrasion without foreign body, right eye, initial encounter: Secondary | ICD-10-CM | POA: Diagnosis not present

## 2015-07-18 DIAGNOSIS — H578 Other specified disorders of eye and adnexa: Secondary | ICD-10-CM | POA: Diagnosis not present

## 2015-07-19 DIAGNOSIS — H04123 Dry eye syndrome of bilateral lacrimal glands: Secondary | ICD-10-CM | POA: Diagnosis not present

## 2015-07-20 DIAGNOSIS — H04123 Dry eye syndrome of bilateral lacrimal glands: Secondary | ICD-10-CM | POA: Diagnosis not present

## 2015-07-21 DIAGNOSIS — M76812 Anterior tibial syndrome, left leg: Secondary | ICD-10-CM | POA: Diagnosis not present

## 2015-07-21 DIAGNOSIS — M79672 Pain in left foot: Secondary | ICD-10-CM | POA: Diagnosis not present

## 2015-08-08 DIAGNOSIS — Z85828 Personal history of other malignant neoplasm of skin: Secondary | ICD-10-CM | POA: Diagnosis not present

## 2015-08-08 DIAGNOSIS — D485 Neoplasm of uncertain behavior of skin: Secondary | ICD-10-CM | POA: Diagnosis not present

## 2015-08-08 DIAGNOSIS — C44311 Basal cell carcinoma of skin of nose: Secondary | ICD-10-CM | POA: Diagnosis not present

## 2015-08-08 DIAGNOSIS — L570 Actinic keratosis: Secondary | ICD-10-CM | POA: Diagnosis not present

## 2015-08-11 DIAGNOSIS — C4490 Unspecified malignant neoplasm of skin, unspecified: Secondary | ICD-10-CM | POA: Diagnosis not present

## 2015-08-11 DIAGNOSIS — S32009A Unspecified fracture of unspecified lumbar vertebra, initial encounter for closed fracture: Secondary | ICD-10-CM | POA: Diagnosis not present

## 2015-08-17 DIAGNOSIS — L570 Actinic keratosis: Secondary | ICD-10-CM | POA: Diagnosis not present

## 2015-08-17 DIAGNOSIS — Z1283 Encounter for screening for malignant neoplasm of skin: Secondary | ICD-10-CM | POA: Diagnosis not present

## 2015-08-17 DIAGNOSIS — C44311 Basal cell carcinoma of skin of nose: Secondary | ICD-10-CM | POA: Diagnosis not present

## 2015-08-17 DIAGNOSIS — X32XXXA Exposure to sunlight, initial encounter: Secondary | ICD-10-CM | POA: Diagnosis not present

## 2015-08-17 DIAGNOSIS — L309 Dermatitis, unspecified: Secondary | ICD-10-CM | POA: Diagnosis not present

## 2015-08-20 IMAGING — CR DG CHEST 2V
3 series · 3 of 3 positions shown · non-contrast
Comparison: 11/04/2011

CLINICAL DATA: Cough

EXAM:
CHEST  2 VIEW

[view not recorded (1 of 3)]
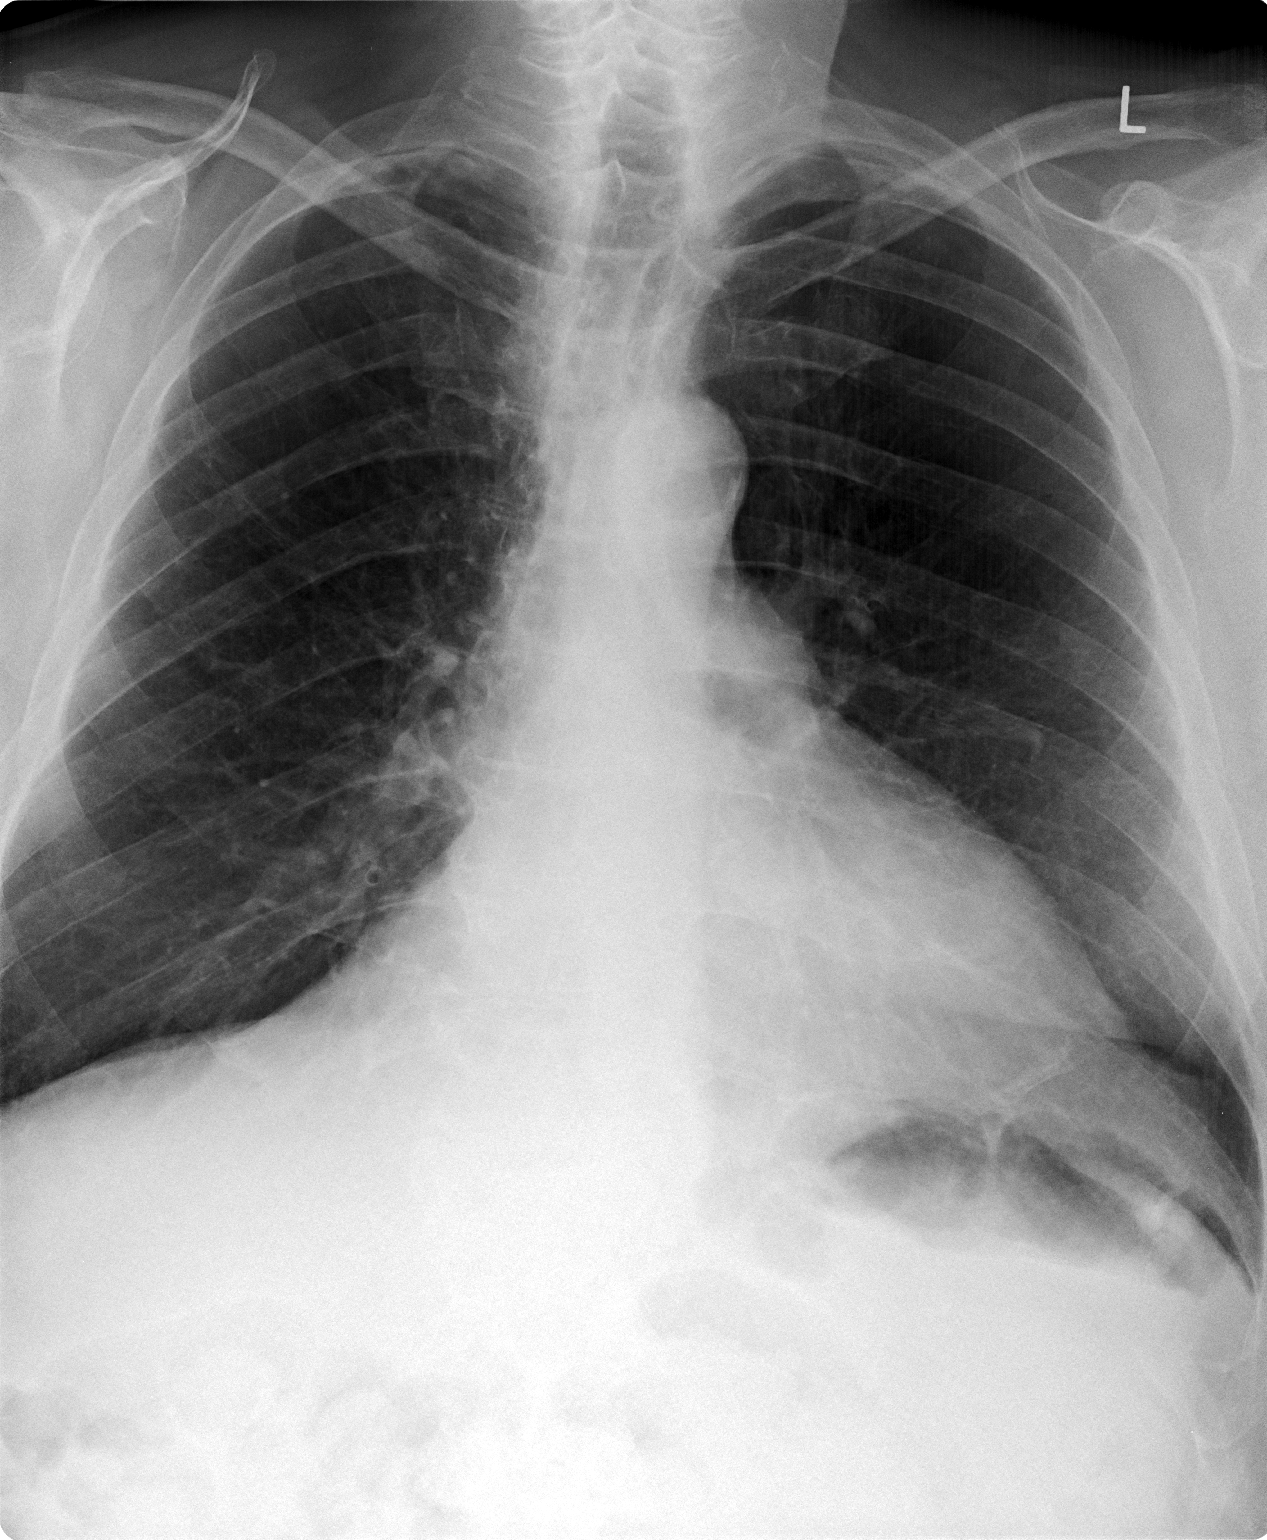

[view not recorded (2 of 3)]
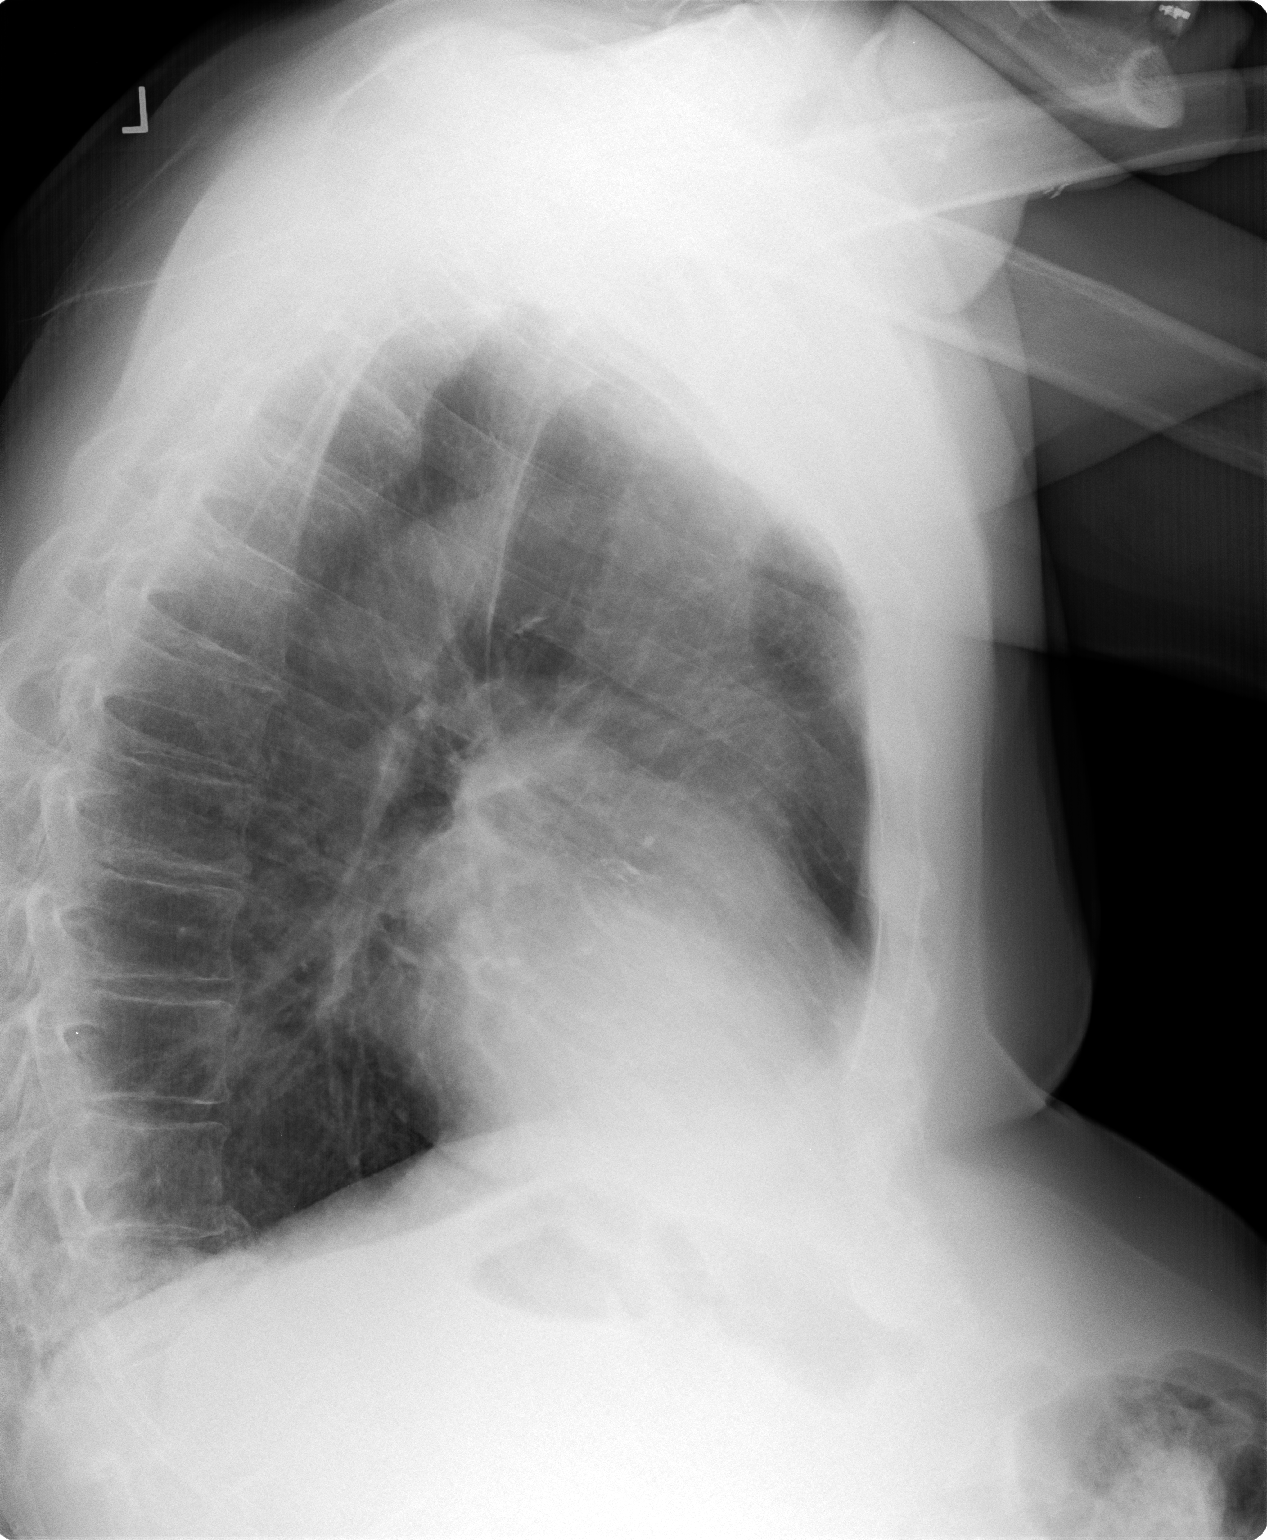

[view not recorded (3 of 3)]
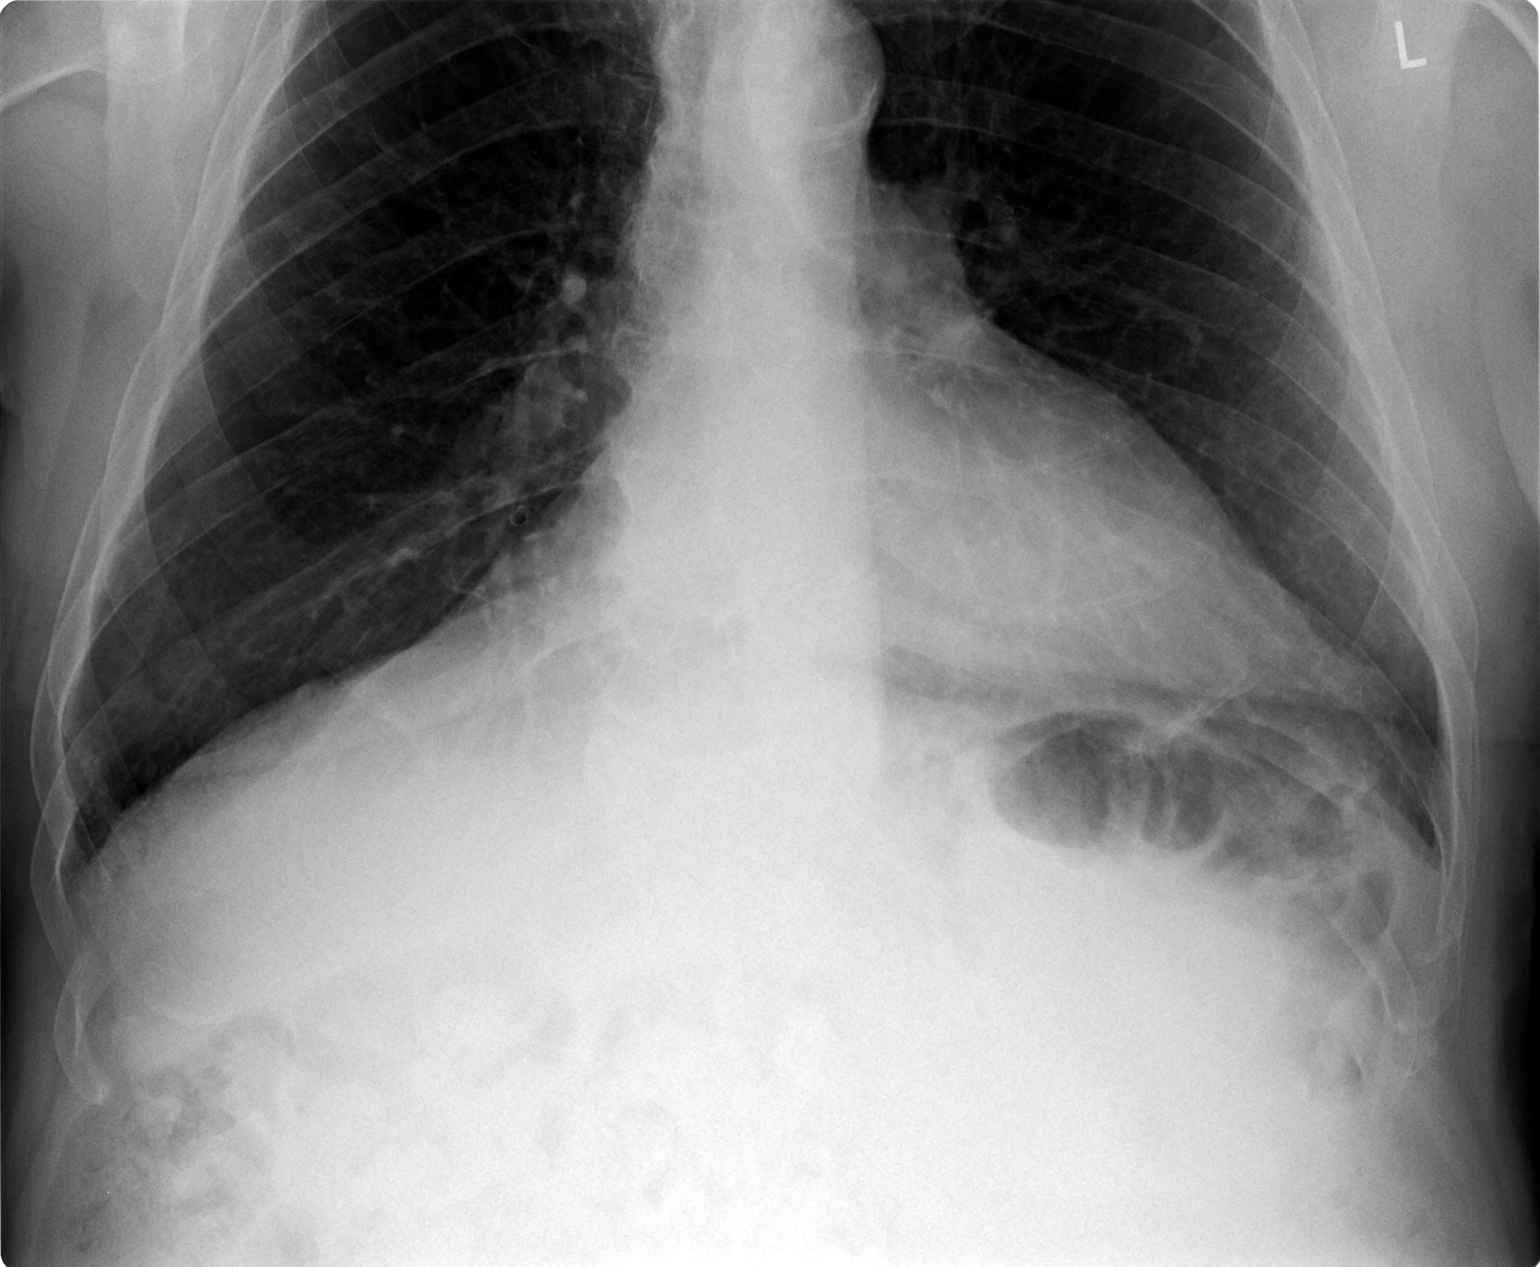

[3 of 3 positions shown; findings below may reference images not displayed]

FINDINGS: Lungs are hyperexpanded. No edema or focal airspace consolidation.
No pleural effusion or pneumothorax. Cardiopericardial silhouette is
at upper limits of normal for size. Bones are demineralized and
lower thoracic compression fracture is stable.
IMPRESSION: Hyperexpansion without acute cardiopulmonary findings.

## 2015-08-22 DIAGNOSIS — B351 Tinea unguium: Secondary | ICD-10-CM | POA: Diagnosis not present

## 2015-08-22 DIAGNOSIS — L84 Corns and callosities: Secondary | ICD-10-CM | POA: Diagnosis not present

## 2015-08-22 DIAGNOSIS — E1151 Type 2 diabetes mellitus with diabetic peripheral angiopathy without gangrene: Secondary | ICD-10-CM | POA: Diagnosis not present

## 2015-08-23 DIAGNOSIS — Z7901 Long term (current) use of anticoagulants: Secondary | ICD-10-CM | POA: Diagnosis not present

## 2015-09-18 DIAGNOSIS — Z85828 Personal history of other malignant neoplasm of skin: Secondary | ICD-10-CM | POA: Diagnosis not present

## 2015-09-18 DIAGNOSIS — Z08 Encounter for follow-up examination after completed treatment for malignant neoplasm: Secondary | ICD-10-CM | POA: Diagnosis not present

## 2015-09-19 DIAGNOSIS — I10 Essential (primary) hypertension: Secondary | ICD-10-CM | POA: Diagnosis not present

## 2015-09-19 DIAGNOSIS — E1121 Type 2 diabetes mellitus with diabetic nephropathy: Secondary | ICD-10-CM | POA: Diagnosis not present

## 2015-09-19 DIAGNOSIS — I482 Chronic atrial fibrillation: Secondary | ICD-10-CM | POA: Diagnosis not present

## 2015-10-26 DIAGNOSIS — L97511 Non-pressure chronic ulcer of other part of right foot limited to breakdown of skin: Secondary | ICD-10-CM | POA: Diagnosis not present

## 2015-11-09 DIAGNOSIS — L97511 Non-pressure chronic ulcer of other part of right foot limited to breakdown of skin: Secondary | ICD-10-CM | POA: Diagnosis not present

## 2015-11-14 DIAGNOSIS — I482 Chronic atrial fibrillation: Secondary | ICD-10-CM | POA: Diagnosis not present

## 2015-11-21 DIAGNOSIS — Z794 Long term (current) use of insulin: Secondary | ICD-10-CM | POA: Diagnosis not present

## 2015-11-21 DIAGNOSIS — E119 Type 2 diabetes mellitus without complications: Secondary | ICD-10-CM | POA: Diagnosis not present

## 2015-11-21 DIAGNOSIS — Z6829 Body mass index (BMI) 29.0-29.9, adult: Secondary | ICD-10-CM | POA: Diagnosis not present

## 2015-11-21 DIAGNOSIS — E871 Hypo-osmolality and hyponatremia: Secondary | ICD-10-CM | POA: Diagnosis not present

## 2015-11-21 DIAGNOSIS — Z800 Family history of malignant neoplasm of digestive organs: Secondary | ICD-10-CM | POA: Diagnosis not present

## 2015-11-21 DIAGNOSIS — J441 Chronic obstructive pulmonary disease with (acute) exacerbation: Secondary | ICD-10-CM | POA: Diagnosis not present

## 2015-11-21 DIAGNOSIS — Z836 Family history of other diseases of the respiratory system: Secondary | ICD-10-CM | POA: Diagnosis not present

## 2015-11-21 DIAGNOSIS — R509 Fever, unspecified: Secondary | ICD-10-CM | POA: Diagnosis not present

## 2015-11-21 DIAGNOSIS — Z7901 Long term (current) use of anticoagulants: Secondary | ICD-10-CM | POA: Diagnosis not present

## 2015-11-21 DIAGNOSIS — I4891 Unspecified atrial fibrillation: Secondary | ICD-10-CM | POA: Diagnosis not present

## 2015-11-21 DIAGNOSIS — Z87891 Personal history of nicotine dependence: Secondary | ICD-10-CM | POA: Diagnosis not present

## 2015-11-21 DIAGNOSIS — Z881 Allergy status to other antibiotic agents status: Secondary | ICD-10-CM | POA: Diagnosis not present

## 2015-11-21 DIAGNOSIS — I482 Chronic atrial fibrillation: Secondary | ICD-10-CM | POA: Diagnosis not present

## 2015-11-21 DIAGNOSIS — Z91048 Other nonmedicinal substance allergy status: Secondary | ICD-10-CM | POA: Diagnosis not present

## 2015-11-21 DIAGNOSIS — Z79899 Other long term (current) drug therapy: Secondary | ICD-10-CM | POA: Diagnosis not present

## 2015-11-21 DIAGNOSIS — M545 Low back pain: Secondary | ICD-10-CM | POA: Diagnosis not present

## 2015-11-21 DIAGNOSIS — R630 Anorexia: Secondary | ICD-10-CM | POA: Diagnosis not present

## 2015-11-21 DIAGNOSIS — G8929 Other chronic pain: Secondary | ICD-10-CM | POA: Diagnosis not present

## 2015-11-22 DIAGNOSIS — E871 Hypo-osmolality and hyponatremia: Secondary | ICD-10-CM | POA: Diagnosis not present

## 2015-11-22 DIAGNOSIS — I482 Chronic atrial fibrillation: Secondary | ICD-10-CM | POA: Diagnosis not present

## 2015-11-22 DIAGNOSIS — M545 Low back pain: Secondary | ICD-10-CM | POA: Diagnosis not present

## 2015-11-22 DIAGNOSIS — G8929 Other chronic pain: Secondary | ICD-10-CM | POA: Diagnosis not present

## 2015-11-22 DIAGNOSIS — I4891 Unspecified atrial fibrillation: Secondary | ICD-10-CM | POA: Diagnosis not present

## 2015-11-22 DIAGNOSIS — J441 Chronic obstructive pulmonary disease with (acute) exacerbation: Secondary | ICD-10-CM | POA: Diagnosis not present

## 2015-11-22 DIAGNOSIS — E119 Type 2 diabetes mellitus without complications: Secondary | ICD-10-CM | POA: Diagnosis not present

## 2015-11-23 DIAGNOSIS — M545 Low back pain: Secondary | ICD-10-CM | POA: Diagnosis not present

## 2015-11-23 DIAGNOSIS — J441 Chronic obstructive pulmonary disease with (acute) exacerbation: Secondary | ICD-10-CM | POA: Diagnosis not present

## 2015-11-23 DIAGNOSIS — E871 Hypo-osmolality and hyponatremia: Secondary | ICD-10-CM | POA: Diagnosis not present

## 2015-11-23 DIAGNOSIS — I4891 Unspecified atrial fibrillation: Secondary | ICD-10-CM | POA: Diagnosis not present

## 2015-11-23 DIAGNOSIS — G8929 Other chronic pain: Secondary | ICD-10-CM | POA: Diagnosis not present

## 2015-11-23 DIAGNOSIS — E119 Type 2 diabetes mellitus without complications: Secondary | ICD-10-CM | POA: Diagnosis not present

## 2015-11-23 DIAGNOSIS — I482 Chronic atrial fibrillation: Secondary | ICD-10-CM | POA: Diagnosis not present

## 2015-11-28 DIAGNOSIS — L97511 Non-pressure chronic ulcer of other part of right foot limited to breakdown of skin: Secondary | ICD-10-CM | POA: Diagnosis not present

## 2015-11-29 ENCOUNTER — Other Ambulatory Visit: Payer: Self-pay | Admitting: Cardiology

## 2015-11-30 NOTE — Telephone Encounter (Signed)
Rx(s) sent to pharmacy electronically.  

## 2015-12-05 DIAGNOSIS — Z0000 Encounter for general adult medical examination without abnormal findings: Secondary | ICD-10-CM | POA: Diagnosis not present

## 2015-12-05 DIAGNOSIS — Z1389 Encounter for screening for other disorder: Secondary | ICD-10-CM | POA: Diagnosis not present

## 2015-12-05 DIAGNOSIS — J441 Chronic obstructive pulmonary disease with (acute) exacerbation: Secondary | ICD-10-CM | POA: Diagnosis not present

## 2015-12-18 DIAGNOSIS — Z85828 Personal history of other malignant neoplasm of skin: Secondary | ICD-10-CM | POA: Diagnosis not present

## 2015-12-18 DIAGNOSIS — C44629 Squamous cell carcinoma of skin of left upper limb, including shoulder: Secondary | ICD-10-CM | POA: Diagnosis not present

## 2015-12-18 DIAGNOSIS — Z08 Encounter for follow-up examination after completed treatment for malignant neoplasm: Secondary | ICD-10-CM | POA: Diagnosis not present

## 2015-12-26 DIAGNOSIS — L97511 Non-pressure chronic ulcer of other part of right foot limited to breakdown of skin: Secondary | ICD-10-CM | POA: Diagnosis not present

## 2015-12-29 DIAGNOSIS — Z87891 Personal history of nicotine dependence: Secondary | ICD-10-CM | POA: Diagnosis not present

## 2015-12-29 DIAGNOSIS — L97519 Non-pressure chronic ulcer of other part of right foot with unspecified severity: Secondary | ICD-10-CM | POA: Diagnosis not present

## 2015-12-29 DIAGNOSIS — E11621 Type 2 diabetes mellitus with foot ulcer: Secondary | ICD-10-CM | POA: Diagnosis not present

## 2015-12-29 DIAGNOSIS — E669 Obesity, unspecified: Secondary | ICD-10-CM | POA: Diagnosis not present

## 2016-01-16 DIAGNOSIS — J441 Chronic obstructive pulmonary disease with (acute) exacerbation: Secondary | ICD-10-CM | POA: Diagnosis not present

## 2016-01-16 DIAGNOSIS — Z1389 Encounter for screening for other disorder: Secondary | ICD-10-CM | POA: Diagnosis not present

## 2016-01-16 DIAGNOSIS — L97511 Non-pressure chronic ulcer of other part of right foot limited to breakdown of skin: Secondary | ICD-10-CM | POA: Diagnosis not present

## 2016-01-18 DIAGNOSIS — L820 Inflamed seborrheic keratosis: Secondary | ICD-10-CM | POA: Diagnosis not present

## 2016-01-18 DIAGNOSIS — Z08 Encounter for follow-up examination after completed treatment for malignant neoplasm: Secondary | ICD-10-CM | POA: Diagnosis not present

## 2016-01-18 DIAGNOSIS — L570 Actinic keratosis: Secondary | ICD-10-CM | POA: Diagnosis not present

## 2016-01-18 DIAGNOSIS — X32XXXD Exposure to sunlight, subsequent encounter: Secondary | ICD-10-CM | POA: Diagnosis not present

## 2016-01-18 DIAGNOSIS — Z85828 Personal history of other malignant neoplasm of skin: Secondary | ICD-10-CM | POA: Diagnosis not present

## 2016-02-05 DIAGNOSIS — L570 Actinic keratosis: Secondary | ICD-10-CM | POA: Diagnosis not present

## 2016-02-05 DIAGNOSIS — X32XXXD Exposure to sunlight, subsequent encounter: Secondary | ICD-10-CM | POA: Diagnosis not present

## 2016-02-20 DIAGNOSIS — B351 Tinea unguium: Secondary | ICD-10-CM | POA: Diagnosis not present

## 2016-02-20 DIAGNOSIS — L84 Corns and callosities: Secondary | ICD-10-CM | POA: Diagnosis not present

## 2016-02-20 DIAGNOSIS — E1142 Type 2 diabetes mellitus with diabetic polyneuropathy: Secondary | ICD-10-CM | POA: Diagnosis not present

## 2016-02-28 DIAGNOSIS — J441 Chronic obstructive pulmonary disease with (acute) exacerbation: Secondary | ICD-10-CM | POA: Diagnosis not present

## 2016-02-28 DIAGNOSIS — E1165 Type 2 diabetes mellitus with hyperglycemia: Secondary | ICD-10-CM | POA: Diagnosis not present

## 2016-02-28 DIAGNOSIS — I482 Chronic atrial fibrillation: Secondary | ICD-10-CM | POA: Diagnosis not present

## 2016-02-28 DIAGNOSIS — Z1389 Encounter for screening for other disorder: Secondary | ICD-10-CM | POA: Diagnosis not present

## 2016-02-28 DIAGNOSIS — R5383 Other fatigue: Secondary | ICD-10-CM | POA: Diagnosis not present

## 2016-02-29 DIAGNOSIS — G4730 Sleep apnea, unspecified: Secondary | ICD-10-CM | POA: Diagnosis not present

## 2016-03-01 DIAGNOSIS — G4730 Sleep apnea, unspecified: Secondary | ICD-10-CM | POA: Diagnosis not present

## 2016-04-03 DIAGNOSIS — I482 Chronic atrial fibrillation: Secondary | ICD-10-CM | POA: Diagnosis not present

## 2016-04-11 DIAGNOSIS — S32009A Unspecified fracture of unspecified lumbar vertebra, initial encounter for closed fracture: Secondary | ICD-10-CM | POA: Diagnosis not present

## 2016-04-11 DIAGNOSIS — S32000A Wedge compression fracture of unspecified lumbar vertebra, initial encounter for closed fracture: Secondary | ICD-10-CM | POA: Diagnosis not present

## 2016-04-11 DIAGNOSIS — M8448XA Pathological fracture, other site, initial encounter for fracture: Secondary | ICD-10-CM | POA: Diagnosis not present

## 2016-04-23 DIAGNOSIS — E1142 Type 2 diabetes mellitus with diabetic polyneuropathy: Secondary | ICD-10-CM | POA: Diagnosis not present

## 2016-04-23 DIAGNOSIS — B351 Tinea unguium: Secondary | ICD-10-CM | POA: Diagnosis not present

## 2016-04-23 DIAGNOSIS — L84 Corns and callosities: Secondary | ICD-10-CM | POA: Diagnosis not present

## 2016-04-30 DIAGNOSIS — M810 Age-related osteoporosis without current pathological fracture: Secondary | ICD-10-CM | POA: Diagnosis not present

## 2016-05-03 DIAGNOSIS — H40033 Anatomical narrow angle, bilateral: Secondary | ICD-10-CM | POA: Diagnosis not present

## 2016-05-03 DIAGNOSIS — E119 Type 2 diabetes mellitus without complications: Secondary | ICD-10-CM | POA: Diagnosis not present

## 2016-05-03 DIAGNOSIS — J441 Chronic obstructive pulmonary disease with (acute) exacerbation: Secondary | ICD-10-CM | POA: Diagnosis not present

## 2016-05-03 DIAGNOSIS — I482 Chronic atrial fibrillation: Secondary | ICD-10-CM | POA: Diagnosis not present

## 2016-05-03 DIAGNOSIS — E1165 Type 2 diabetes mellitus with hyperglycemia: Secondary | ICD-10-CM | POA: Diagnosis not present

## 2016-05-06 DIAGNOSIS — I482 Chronic atrial fibrillation: Secondary | ICD-10-CM | POA: Diagnosis not present

## 2016-05-23 DIAGNOSIS — L570 Actinic keratosis: Secondary | ICD-10-CM | POA: Diagnosis not present

## 2016-05-23 DIAGNOSIS — L723 Sebaceous cyst: Secondary | ICD-10-CM | POA: Diagnosis not present

## 2016-05-23 DIAGNOSIS — C4441 Basal cell carcinoma of skin of scalp and neck: Secondary | ICD-10-CM | POA: Diagnosis not present

## 2016-05-23 DIAGNOSIS — X32XXXD Exposure to sunlight, subsequent encounter: Secondary | ICD-10-CM | POA: Diagnosis not present

## 2016-05-30 DIAGNOSIS — J441 Chronic obstructive pulmonary disease with (acute) exacerbation: Secondary | ICD-10-CM | POA: Diagnosis not present

## 2016-05-30 DIAGNOSIS — E1165 Type 2 diabetes mellitus with hyperglycemia: Secondary | ICD-10-CM | POA: Diagnosis not present

## 2016-05-30 DIAGNOSIS — I482 Chronic atrial fibrillation: Secondary | ICD-10-CM | POA: Diagnosis not present

## 2016-05-31 DIAGNOSIS — G4739 Other sleep apnea: Secondary | ICD-10-CM | POA: Diagnosis not present

## 2016-05-31 DIAGNOSIS — E1165 Type 2 diabetes mellitus with hyperglycemia: Secondary | ICD-10-CM | POA: Diagnosis not present

## 2016-05-31 DIAGNOSIS — J441 Chronic obstructive pulmonary disease with (acute) exacerbation: Secondary | ICD-10-CM | POA: Diagnosis not present

## 2016-05-31 DIAGNOSIS — I482 Chronic atrial fibrillation: Secondary | ICD-10-CM | POA: Diagnosis not present

## 2016-06-18 ENCOUNTER — Other Ambulatory Visit: Payer: Self-pay | Admitting: Nurse Practitioner

## 2016-06-18 DIAGNOSIS — J441 Chronic obstructive pulmonary disease with (acute) exacerbation: Secondary | ICD-10-CM | POA: Diagnosis not present

## 2016-06-18 DIAGNOSIS — I482 Chronic atrial fibrillation: Secondary | ICD-10-CM | POA: Diagnosis not present

## 2016-06-18 DIAGNOSIS — E1165 Type 2 diabetes mellitus with hyperglycemia: Secondary | ICD-10-CM | POA: Diagnosis not present

## 2016-06-18 MED ORDER — MEBENDAZOLE 100 MG PO CHEW: 100.0000 mg | CHEWABLE_TABLET | Freq: Once | ORAL | 0 refills | Status: AC

## 2016-06-20 DIAGNOSIS — L84 Corns and callosities: Secondary | ICD-10-CM | POA: Diagnosis not present

## 2016-06-20 DIAGNOSIS — E1142 Type 2 diabetes mellitus with diabetic polyneuropathy: Secondary | ICD-10-CM | POA: Diagnosis not present

## 2016-06-20 DIAGNOSIS — B351 Tinea unguium: Secondary | ICD-10-CM | POA: Diagnosis not present

## 2016-07-16 DIAGNOSIS — S32009A Unspecified fracture of unspecified lumbar vertebra, initial encounter for closed fracture: Secondary | ICD-10-CM | POA: Diagnosis not present

## 2016-07-16 DIAGNOSIS — M8448XA Pathological fracture, other site, initial encounter for fracture: Secondary | ICD-10-CM | POA: Diagnosis not present

## 2016-07-16 DIAGNOSIS — S32000A Wedge compression fracture of unspecified lumbar vertebra, initial encounter for closed fracture: Secondary | ICD-10-CM | POA: Diagnosis not present

## 2016-07-18 DIAGNOSIS — Z85828 Personal history of other malignant neoplasm of skin: Secondary | ICD-10-CM | POA: Diagnosis not present

## 2016-07-18 DIAGNOSIS — L570 Actinic keratosis: Secondary | ICD-10-CM | POA: Diagnosis not present

## 2016-07-18 DIAGNOSIS — Z08 Encounter for follow-up examination after completed treatment for malignant neoplasm: Secondary | ICD-10-CM | POA: Diagnosis not present

## 2016-07-18 DIAGNOSIS — L820 Inflamed seborrheic keratosis: Secondary | ICD-10-CM | POA: Diagnosis not present

## 2016-07-18 DIAGNOSIS — X32XXXD Exposure to sunlight, subsequent encounter: Secondary | ICD-10-CM | POA: Diagnosis not present

## 2016-07-23 DIAGNOSIS — J441 Chronic obstructive pulmonary disease with (acute) exacerbation: Secondary | ICD-10-CM | POA: Diagnosis not present

## 2016-07-23 DIAGNOSIS — E1165 Type 2 diabetes mellitus with hyperglycemia: Secondary | ICD-10-CM | POA: Diagnosis not present

## 2016-07-23 DIAGNOSIS — I482 Chronic atrial fibrillation: Secondary | ICD-10-CM | POA: Diagnosis not present

## 2016-08-20 DIAGNOSIS — E1165 Type 2 diabetes mellitus with hyperglycemia: Secondary | ICD-10-CM | POA: Diagnosis not present

## 2016-08-20 DIAGNOSIS — J441 Chronic obstructive pulmonary disease with (acute) exacerbation: Secondary | ICD-10-CM | POA: Diagnosis not present

## 2016-08-20 DIAGNOSIS — G4739 Other sleep apnea: Secondary | ICD-10-CM | POA: Diagnosis not present

## 2016-08-20 DIAGNOSIS — I482 Chronic atrial fibrillation: Secondary | ICD-10-CM | POA: Diagnosis not present

## 2016-08-27 DIAGNOSIS — B351 Tinea unguium: Secondary | ICD-10-CM | POA: Diagnosis not present

## 2016-08-27 DIAGNOSIS — E1142 Type 2 diabetes mellitus with diabetic polyneuropathy: Secondary | ICD-10-CM | POA: Diagnosis not present

## 2016-08-27 DIAGNOSIS — L84 Corns and callosities: Secondary | ICD-10-CM | POA: Diagnosis not present

## 2016-09-05 ENCOUNTER — Encounter: Payer: Self-pay | Admitting: Cardiology

## 2016-09-11 DIAGNOSIS — I482 Chronic atrial fibrillation: Secondary | ICD-10-CM | POA: Diagnosis not present

## 2016-09-16 DIAGNOSIS — J441 Chronic obstructive pulmonary disease with (acute) exacerbation: Secondary | ICD-10-CM | POA: Diagnosis not present

## 2016-09-16 DIAGNOSIS — I482 Chronic atrial fibrillation: Secondary | ICD-10-CM | POA: Diagnosis not present

## 2016-09-16 DIAGNOSIS — E1165 Type 2 diabetes mellitus with hyperglycemia: Secondary | ICD-10-CM | POA: Diagnosis not present

## 2016-09-18 NOTE — Progress Notes (Deleted)
HPI The patient presents for evaluation of atrial fibrillation. He has had a history of SVT ablated twice around 2010 when he lived in the [place]. He had atrial fibrillation following this and had cardioversion twice.  He is now in persistent atrial fib.  After a previous visit I did get him wear a Holter monitor which demonstrated reasonable rate control. Since I last saw him he's ***  had 2 amputated and more fractures in his back. He still does the household chores. With this he denies any cardiovascular symptoms. The patient denies any new symptoms such as chest discomfort, neck or arm discomfort. There has been no new shortness of breath, PND or orthopnea. There have been no reported palpitations, presyncope or syncope.  Allergies  Allergen Reactions  . Betadine [Povidone Iodine] Rash    Current Outpatient Prescriptions  Medication Sig Dispense Refill  . diltiazem (CARDIZEM CD) 240 MG 24 hr capsule TAKE ONE (1) CAPSULE EACH DAY 90 capsule 2  . metFORMIN (GLUCOPHAGE) 1000 MG tablet Take 1,000 mg by mouth daily with breakfast.     . warfarin (COUMADIN) 5 MG tablet Take 5 mg by mouth daily.     No current facility-administered medications for this visit.     Past Medical History:  Diagnosis Date  . Atrial fibrillation ([place])   . Cardiac dysrhythmia, unspecified   . Compression fracture of L4 lumbar vertebra ([facility])   . Compression fracture of T12 vertebra ([facility])   . Diabetes mellitus without complication ([place])   . Other and unspecified hyperlipidemia   . SVT (supraventricular tachycardia) ([place])   . Unspecified essential hypertension   . Vitamin D deficiency     Past Surgical History:  Procedure Laterality Date  . ABLATION     x2  . CARDIOVERSION     x2  . HIATAL HERNIA REPAIR     x2  . KYPHOPLASTY    . SKIN CANCER EXCISION      ROS:  Positive for back pain ***. Otherwise as stated in the HPI and negative for all other systems.  PHYSICAL EXAM There were no vitals  taken for this visit. GENERAL:  Well appearing HEENT:  Pupils equal round and reactive, fundi not visualized, oral mucosa unremarkable NECK:  No jugular venous distention, waveform within normal limits, carotid upstroke brisk and symmetric, no bruits, no thyromegaly LYMPHATICS:  No cervical, inguinal adenopathy LUNGS:  Clear to auscultation bilaterally BACK:  No CVA tenderness,  CHEST:  Unremarkable HEART:  PMI not displaced or sustained,S1 and S2 within normal limits, no S3, no clicks, no rubs, no murmurs, irregular ABD:  Flat, positive bowel sounds normal in frequency in pitch, no bruits, no rebound, no guarding, no midline pulsatile mass, no hepatomegaly, no splenomegaly EXT:  2 plus pulses throughout, mild ankle edema, no cyanosis no clubbing   EKG:  Atrial fibrillation, rate ***, axis left axis deviation, left anterior fascicular block, anterior R wave progression, no acute ST-T wave changes.   ASSESSMENT AND PLAN  Atrial fibrillation -   The patient  tolerates this rhythm and rate control and anticoagulation. We will continue with the meds as listed.   [name] has a CHA2DS2 - VASc score of 4 with a risk of stroke of 4% .  He could not afford NOAC and so he will continue with warfarin.  By his report and description he is compliant with follow-up.  SVT -  He has had no recurrence of this. No change in therapy is indicated.  HTN - The blood pressure is at target. No change in medications is indicated. We will continue with therapeutic lifestyle changes (TLC).

## 2016-09-19 ENCOUNTER — Ambulatory Visit: Payer: Medicare Other | Admitting: Cardiology

## 2016-09-19 ENCOUNTER — Telehealth: Payer: Self-pay | Admitting: Cardiology

## 2016-09-19 NOTE — Telephone Encounter (Signed)
New message       Pt had an appt today with Dr Percival Spanish at [facility] but he showed up at the [facility] office.  The appt was rescheduled for 10-23-16.  Is it ok to wait until 10-23-16?  The appt said EKG follow up.

## 2016-09-19 NOTE — Telephone Encounter (Signed)
Thank you for checking.  The pt was due for 1 yr f/u in June of this year.  He has only ever been seen in the [place] office and should not have been scheduled to be seen at [facility].  The notes only say EKG because the person prepping the chart marked it that he is due to have one. OK for the pt to wait until 12/17 to be seen unless he is having cardiac problems.

## 2016-10-11 DIAGNOSIS — G9589 Other specified diseases of spinal cord: Secondary | ICD-10-CM | POA: Diagnosis not present

## 2016-10-11 DIAGNOSIS — S32029A Unspecified fracture of second lumbar vertebra, initial encounter for closed fracture: Secondary | ICD-10-CM | POA: Diagnosis not present

## 2016-10-11 DIAGNOSIS — S32049A Unspecified fracture of fourth lumbar vertebra, initial encounter for closed fracture: Secondary | ICD-10-CM | POA: Diagnosis not present

## 2016-10-11 DIAGNOSIS — S22089A Unspecified fracture of T11-T12 vertebra, initial encounter for closed fracture: Secondary | ICD-10-CM | POA: Diagnosis not present

## 2016-10-11 DIAGNOSIS — Z981 Arthrodesis status: Secondary | ICD-10-CM | POA: Diagnosis not present

## 2016-10-14 DIAGNOSIS — L570 Actinic keratosis: Secondary | ICD-10-CM | POA: Diagnosis not present

## 2016-10-14 DIAGNOSIS — X32XXXD Exposure to sunlight, subsequent encounter: Secondary | ICD-10-CM | POA: Diagnosis not present

## 2016-10-16 DIAGNOSIS — I482 Chronic atrial fibrillation: Secondary | ICD-10-CM | POA: Diagnosis not present

## 2016-10-16 DIAGNOSIS — E1165 Type 2 diabetes mellitus with hyperglycemia: Secondary | ICD-10-CM | POA: Diagnosis not present

## 2016-10-17 DIAGNOSIS — S32000A Wedge compression fracture of unspecified lumbar vertebra, initial encounter for closed fracture: Secondary | ICD-10-CM | POA: Diagnosis not present

## 2016-10-17 DIAGNOSIS — E1165 Type 2 diabetes mellitus with hyperglycemia: Secondary | ICD-10-CM | POA: Diagnosis not present

## 2016-10-17 DIAGNOSIS — J441 Chronic obstructive pulmonary disease with (acute) exacerbation: Secondary | ICD-10-CM | POA: Diagnosis not present

## 2016-10-17 DIAGNOSIS — S32009A Unspecified fracture of unspecified lumbar vertebra, initial encounter for closed fracture: Secondary | ICD-10-CM | POA: Diagnosis not present

## 2016-10-17 DIAGNOSIS — M8448XA Pathological fracture, other site, initial encounter for fracture: Secondary | ICD-10-CM | POA: Diagnosis not present

## 2016-10-17 DIAGNOSIS — I482 Chronic atrial fibrillation: Secondary | ICD-10-CM | POA: Diagnosis not present

## 2016-10-23 ENCOUNTER — Ambulatory Visit: Payer: Medicare Other | Admitting: Cardiology

## 2016-10-29 DIAGNOSIS — E1142 Type 2 diabetes mellitus with diabetic polyneuropathy: Secondary | ICD-10-CM | POA: Diagnosis not present

## 2016-10-29 DIAGNOSIS — L84 Corns and callosities: Secondary | ICD-10-CM | POA: Diagnosis not present

## 2016-10-29 DIAGNOSIS — B351 Tinea unguium: Secondary | ICD-10-CM | POA: Diagnosis not present

## 2016-11-23 ENCOUNTER — Other Ambulatory Visit: Payer: Self-pay | Admitting: Cardiology

## 2016-12-09 DIAGNOSIS — J441 Chronic obstructive pulmonary disease with (acute) exacerbation: Secondary | ICD-10-CM | POA: Diagnosis not present

## 2016-12-09 DIAGNOSIS — Z0000 Encounter for general adult medical examination without abnormal findings: Secondary | ICD-10-CM | POA: Diagnosis not present

## 2016-12-09 DIAGNOSIS — E1165 Type 2 diabetes mellitus with hyperglycemia: Secondary | ICD-10-CM | POA: Diagnosis not present

## 2016-12-09 DIAGNOSIS — I482 Chronic atrial fibrillation: Secondary | ICD-10-CM | POA: Diagnosis not present

## 2016-12-09 DIAGNOSIS — Z1389 Encounter for screening for other disorder: Secondary | ICD-10-CM | POA: Diagnosis not present

## 2016-12-09 DIAGNOSIS — G4739 Other sleep apnea: Secondary | ICD-10-CM | POA: Diagnosis not present

## 2016-12-10 NOTE — Progress Notes (Signed)
HPI The patient presents for evaluation of atrial fibrillation. He has had a history of SVT ablated twice around 2010 when he lived in the [place]. He had atrial fibrillation following this and had cardioversion twice.  He is now in persistent atrial fib.  He denies any cardiovascular symptoms. He's fatigued because he doesn't sleep well. He actually says he sleeps but doesn't rest. He has sleep apnea but has never wanted to use CPAP. He also loosens up his wife who has some medical problems. He denies any palpitations, presyncope or syncope. He chest pressure, neck or arm discomfort. There is slowly with back problems and joint problems  Allergies  Allergen Reactions  . Betadine [Povidone Iodine] Rash    Current Outpatient Prescriptions  Medication Sig Dispense Refill  . BIOTIN PO Take 1 capsule by mouth daily.    Marland Kitchen CRANBERRY EXTRACT PO Take 1 capsule by mouth daily.    Marland Kitchen diltiazem (CARDIZEM CD) 240 MG 24 hr capsule TAKE ONE (1) CAPSULE EACH DAY 90 capsule 0  . metFORMIN (GLUCOPHAGE) 1000 MG tablet Take 1,000 mg by mouth daily with breakfast.     . Multiple Vitamin (MULTIVITAMIN) tablet Take 1 tablet by mouth daily.    . Omega-3 Fatty Acids (FISH OIL) 1000 MG CAPS Take 1 capsule by mouth daily.    Marland Kitchen warfarin (COUMADIN) 5 MG tablet Take 5 mg by mouth daily.     No current facility-administered medications for this visit.     Past Medical History:  Diagnosis Date  . Atrial fibrillation ([place])   . Cardiac dysrhythmia, unspecified   . Compression fracture of L4 lumbar vertebra (HCC)   . Compression fracture of T12 vertebra (HCC)   . Diabetes mellitus without complication ([place])   . Other and unspecified hyperlipidemia   . Sleep apnea   . SVT (supraventricular tachycardia) ([place])   . Unspecified essential hypertension   . Vitamin D deficiency     Past Surgical History:  Procedure Laterality Date  . ABLATION     x2  . CARDIOVERSION     x2  . HIATAL HERNIA REPAIR     x2  .  KYPHOPLASTY    . SKIN CANCER EXCISION      ROS:  Positive for decreased sex drive. Otherwise as stated in the HPI and negative for all other systems.  PHYSICAL EXAM BP 136/76   Pulse 66   Ht 5\' 8"  (1.727 m)   Wt 199 lb (90.3 kg)   BMI 30.26 kg/m  GENERAL:  Well appearing HEENT:  Pupils equal round and reactive, fundi not visualized, oral mucosa unremarkable NECK:  No jugular venous distention, waveform within normal limits, carotid upstroke brisk and symmetric, no bruits, no thyromegaly LYMPHATICS:  No cervical, inguinal adenopathy LUNGS:  Clear to auscultation bilaterally BACK:  No CVA tenderness, kyphosis CHEST:  Unremarkable HEART:  PMI not displaced or sustained,S1 and S2 within normal limits, no S3, no clicks, no rubs, no murmurs, irregular ABD:  Flat, positive bowel sounds normal in frequency in pitch, no bruits, no rebound, no guarding, no midline pulsatile mass, no hepatomegaly, no splenomegaly EXT:  2 plus pulses throughout, mild ankle edema, no cyanosis no clubbing   EKG:  Atrial fibrillation, rate 81, axis left axis deviation, left anterior fascicular block, anterior R wave progression, no acute ST-T wave changes.   ASSESSMENT AND PLAN  Atrial fibrillation -   The patient  tolerates this rhythm and rate control and anticoagulation. We will continue with the meds  as listed.   [name] has a CHA2DS2 - VASc score of 4 with a risk of stroke of 4% .  He could not afford NOAC and so he will continue with warfarin.  He should not be taking ASA and I took this off his med list.   SVT -  He has had no recurrence of this. No change in therapy is indicated.  HTN - The blood pressure is at target. No change in medications is indicated. We will continue with therapeutic lifestyle changes (TLC).

## 2016-12-11 ENCOUNTER — Ambulatory Visit (INDEPENDENT_AMBULATORY_CARE_PROVIDER_SITE_OTHER): Payer: Medicare Other | Admitting: Cardiology

## 2016-12-11 ENCOUNTER — Encounter: Payer: Self-pay | Admitting: Cardiology

## 2016-12-11 VITALS — BP 136/76 | HR 66 | Ht 68.0 in | Wt 199.0 lb

## 2016-12-11 DIAGNOSIS — E1165 Type 2 diabetes mellitus with hyperglycemia: Secondary | ICD-10-CM | POA: Diagnosis not present

## 2016-12-11 DIAGNOSIS — I482 Chronic atrial fibrillation: Secondary | ICD-10-CM | POA: Diagnosis not present

## 2016-12-11 DIAGNOSIS — Z0000 Encounter for general adult medical examination without abnormal findings: Secondary | ICD-10-CM | POA: Diagnosis not present

## 2016-12-11 DIAGNOSIS — E291 Testicular hypofunction: Secondary | ICD-10-CM | POA: Diagnosis not present

## 2016-12-11 DIAGNOSIS — I4821 Permanent atrial fibrillation: Secondary | ICD-10-CM

## 2016-12-11 NOTE — Patient Instructions (Signed)
Medication Instructions:  Please stop your ASA.  Continue all other medications as listed.  Follow-Up: Follow up in 1 year with Dr. Percival Spanish.  You will receive a letter in the mail 2 months before you are due.  Please call us when you receive this letter to schedule your follow up appointment.  If you need a refill on your cardiac medications before your next appointment, please call your pharmacy.  Thank you for choosing [facility]!!

## 2016-12-12 ENCOUNTER — Encounter: Payer: Self-pay | Admitting: Cardiology

## 2016-12-17 DIAGNOSIS — I482 Chronic atrial fibrillation: Secondary | ICD-10-CM | POA: Diagnosis not present

## 2016-12-17 DIAGNOSIS — E1165 Type 2 diabetes mellitus with hyperglycemia: Secondary | ICD-10-CM | POA: Diagnosis not present

## 2016-12-17 DIAGNOSIS — J441 Chronic obstructive pulmonary disease with (acute) exacerbation: Secondary | ICD-10-CM | POA: Diagnosis not present

## 2017-01-09 DIAGNOSIS — B351 Tinea unguium: Secondary | ICD-10-CM | POA: Diagnosis not present

## 2017-01-09 DIAGNOSIS — L84 Corns and callosities: Secondary | ICD-10-CM | POA: Diagnosis not present

## 2017-01-09 DIAGNOSIS — E1142 Type 2 diabetes mellitus with diabetic polyneuropathy: Secondary | ICD-10-CM | POA: Diagnosis not present

## 2017-01-13 DIAGNOSIS — Z08 Encounter for follow-up examination after completed treatment for malignant neoplasm: Secondary | ICD-10-CM | POA: Diagnosis not present

## 2017-01-13 DIAGNOSIS — Z85828 Personal history of other malignant neoplasm of skin: Secondary | ICD-10-CM | POA: Diagnosis not present

## 2017-01-13 DIAGNOSIS — C44319 Basal cell carcinoma of skin of other parts of face: Secondary | ICD-10-CM | POA: Diagnosis not present

## 2017-01-15 DIAGNOSIS — I482 Chronic atrial fibrillation: Secondary | ICD-10-CM | POA: Diagnosis not present

## 2017-01-22 DIAGNOSIS — I482 Chronic atrial fibrillation: Secondary | ICD-10-CM | POA: Diagnosis not present

## 2017-01-22 DIAGNOSIS — J441 Chronic obstructive pulmonary disease with (acute) exacerbation: Secondary | ICD-10-CM | POA: Diagnosis not present

## 2017-01-22 DIAGNOSIS — E1165 Type 2 diabetes mellitus with hyperglycemia: Secondary | ICD-10-CM | POA: Diagnosis not present

## 2017-01-28 DIAGNOSIS — S32009A Unspecified fracture of unspecified lumbar vertebra, initial encounter for closed fracture: Secondary | ICD-10-CM | POA: Diagnosis not present

## 2017-01-28 DIAGNOSIS — M810 Age-related osteoporosis without current pathological fracture: Secondary | ICD-10-CM | POA: Diagnosis not present

## 2017-01-28 DIAGNOSIS — M8448XA Pathological fracture, other site, initial encounter for fracture: Secondary | ICD-10-CM | POA: Diagnosis not present

## 2017-01-28 DIAGNOSIS — S32000A Wedge compression fracture of unspecified lumbar vertebra, initial encounter for closed fracture: Secondary | ICD-10-CM | POA: Diagnosis not present

## 2017-02-10 DIAGNOSIS — Z08 Encounter for follow-up examination after completed treatment for malignant neoplasm: Secondary | ICD-10-CM | POA: Diagnosis not present

## 2017-02-10 DIAGNOSIS — Z85828 Personal history of other malignant neoplasm of skin: Secondary | ICD-10-CM | POA: Diagnosis not present

## 2017-02-10 DIAGNOSIS — L821 Other seborrheic keratosis: Secondary | ICD-10-CM | POA: Diagnosis not present

## 2017-02-10 DIAGNOSIS — D485 Neoplasm of uncertain behavior of skin: Secondary | ICD-10-CM | POA: Diagnosis not present

## 2017-02-19 DIAGNOSIS — I482 Chronic atrial fibrillation: Secondary | ICD-10-CM | POA: Diagnosis not present

## 2017-02-23 ENCOUNTER — Other Ambulatory Visit: Payer: Self-pay | Admitting: Cardiology

## 2017-03-10 DIAGNOSIS — C44319 Basal cell carcinoma of skin of other parts of face: Secondary | ICD-10-CM | POA: Diagnosis not present

## 2017-03-10 DIAGNOSIS — L820 Inflamed seborrheic keratosis: Secondary | ICD-10-CM | POA: Diagnosis not present

## 2017-03-10 DIAGNOSIS — T148XXD Other injury of unspecified body region, subsequent encounter: Secondary | ICD-10-CM | POA: Diagnosis not present

## 2017-03-13 DIAGNOSIS — I482 Chronic atrial fibrillation: Secondary | ICD-10-CM | POA: Diagnosis not present

## 2017-03-13 DIAGNOSIS — E1165 Type 2 diabetes mellitus with hyperglycemia: Secondary | ICD-10-CM | POA: Diagnosis not present

## 2017-03-13 DIAGNOSIS — J441 Chronic obstructive pulmonary disease with (acute) exacerbation: Secondary | ICD-10-CM | POA: Diagnosis not present

## 2017-03-20 DIAGNOSIS — E1142 Type 2 diabetes mellitus with diabetic polyneuropathy: Secondary | ICD-10-CM | POA: Diagnosis not present

## 2017-03-20 DIAGNOSIS — L84 Corns and callosities: Secondary | ICD-10-CM | POA: Diagnosis not present

## 2017-03-20 DIAGNOSIS — B351 Tinea unguium: Secondary | ICD-10-CM | POA: Diagnosis not present

## 2017-03-21 DIAGNOSIS — G4739 Other sleep apnea: Secondary | ICD-10-CM | POA: Diagnosis not present

## 2017-03-21 DIAGNOSIS — Z79899 Other long term (current) drug therapy: Secondary | ICD-10-CM | POA: Diagnosis not present

## 2017-03-21 DIAGNOSIS — I482 Chronic atrial fibrillation: Secondary | ICD-10-CM | POA: Diagnosis not present

## 2017-03-21 DIAGNOSIS — E1165 Type 2 diabetes mellitus with hyperglycemia: Secondary | ICD-10-CM | POA: Diagnosis not present

## 2017-03-21 DIAGNOSIS — J441 Chronic obstructive pulmonary disease with (acute) exacerbation: Secondary | ICD-10-CM | POA: Diagnosis not present

## 2017-04-21 DIAGNOSIS — C44219 Basal cell carcinoma of skin of left ear and external auricular canal: Secondary | ICD-10-CM | POA: Diagnosis not present

## 2017-04-21 DIAGNOSIS — Z85828 Personal history of other malignant neoplasm of skin: Secondary | ICD-10-CM | POA: Diagnosis not present

## 2017-04-21 DIAGNOSIS — Z08 Encounter for follow-up examination after completed treatment for malignant neoplasm: Secondary | ICD-10-CM | POA: Diagnosis not present

## 2017-05-03 DIAGNOSIS — H40033 Anatomical narrow angle, bilateral: Secondary | ICD-10-CM | POA: Diagnosis not present

## 2017-05-03 DIAGNOSIS — E119 Type 2 diabetes mellitus without complications: Secondary | ICD-10-CM | POA: Diagnosis not present

## 2017-05-07 DIAGNOSIS — I482 Chronic atrial fibrillation: Secondary | ICD-10-CM | POA: Diagnosis not present

## 2017-05-18 DIAGNOSIS — Z7984 Long term (current) use of oral hypoglycemic drugs: Secondary | ICD-10-CM | POA: Diagnosis not present

## 2017-05-18 DIAGNOSIS — R05 Cough: Secondary | ICD-10-CM | POA: Diagnosis not present

## 2017-05-18 DIAGNOSIS — J440 Chronic obstructive pulmonary disease with acute lower respiratory infection: Secondary | ICD-10-CM | POA: Diagnosis not present

## 2017-05-18 DIAGNOSIS — Z7901 Long term (current) use of anticoagulants: Secondary | ICD-10-CM | POA: Diagnosis not present

## 2017-05-18 DIAGNOSIS — Z87891 Personal history of nicotine dependence: Secondary | ICD-10-CM | POA: Diagnosis not present

## 2017-05-18 DIAGNOSIS — E119 Type 2 diabetes mellitus without complications: Secondary | ICD-10-CM | POA: Diagnosis not present

## 2017-05-18 DIAGNOSIS — E860 Dehydration: Secondary | ICD-10-CM | POA: Diagnosis not present

## 2017-05-18 DIAGNOSIS — Z79899 Other long term (current) drug therapy: Secondary | ICD-10-CM | POA: Diagnosis not present

## 2017-05-18 DIAGNOSIS — J449 Chronic obstructive pulmonary disease, unspecified: Secondary | ICD-10-CM | POA: Diagnosis not present

## 2017-05-18 DIAGNOSIS — M810 Age-related osteoporosis without current pathological fracture: Secondary | ICD-10-CM | POA: Diagnosis not present

## 2017-05-18 DIAGNOSIS — J209 Acute bronchitis, unspecified: Secondary | ICD-10-CM | POA: Diagnosis not present

## 2017-05-18 DIAGNOSIS — I4891 Unspecified atrial fibrillation: Secondary | ICD-10-CM | POA: Diagnosis not present

## 2017-05-19 DIAGNOSIS — J028 Acute pharyngitis due to other specified organisms: Secondary | ICD-10-CM | POA: Diagnosis not present

## 2017-05-20 DIAGNOSIS — S32009A Unspecified fracture of unspecified lumbar vertebra, initial encounter for closed fracture: Secondary | ICD-10-CM | POA: Diagnosis not present

## 2017-05-20 DIAGNOSIS — S32000A Wedge compression fracture of unspecified lumbar vertebra, initial encounter for closed fracture: Secondary | ICD-10-CM | POA: Diagnosis not present

## 2017-05-20 DIAGNOSIS — M810 Age-related osteoporosis without current pathological fracture: Secondary | ICD-10-CM | POA: Diagnosis not present

## 2017-05-27 DIAGNOSIS — B351 Tinea unguium: Secondary | ICD-10-CM | POA: Diagnosis not present

## 2017-05-27 DIAGNOSIS — E1142 Type 2 diabetes mellitus with diabetic polyneuropathy: Secondary | ICD-10-CM | POA: Diagnosis not present

## 2017-05-27 DIAGNOSIS — L84 Corns and callosities: Secondary | ICD-10-CM | POA: Diagnosis not present

## 2017-05-27 DIAGNOSIS — M79676 Pain in unspecified toe(s): Secondary | ICD-10-CM | POA: Diagnosis not present

## 2017-06-09 ENCOUNTER — Telehealth (INDEPENDENT_AMBULATORY_CARE_PROVIDER_SITE_OTHER): Payer: Self-pay | Admitting: Orthopaedic Surgery

## 2017-06-19 DIAGNOSIS — R0600 Dyspnea, unspecified: Secondary | ICD-10-CM | POA: Diagnosis not present

## 2017-06-19 DIAGNOSIS — J8410 Pulmonary fibrosis, unspecified: Secondary | ICD-10-CM | POA: Diagnosis not present

## 2017-06-23 DIAGNOSIS — R590 Localized enlarged lymph nodes: Secondary | ICD-10-CM | POA: Diagnosis not present

## 2017-06-23 DIAGNOSIS — J432 Centrilobular emphysema: Secondary | ICD-10-CM | POA: Diagnosis not present

## 2017-06-23 DIAGNOSIS — R911 Solitary pulmonary nodule: Secondary | ICD-10-CM | POA: Diagnosis not present

## 2017-06-23 DIAGNOSIS — R918 Other nonspecific abnormal finding of lung field: Secondary | ICD-10-CM | POA: Diagnosis not present

## 2017-06-23 DIAGNOSIS — J8410 Pulmonary fibrosis, unspecified: Secondary | ICD-10-CM | POA: Diagnosis not present

## 2017-06-23 DIAGNOSIS — J439 Emphysema, unspecified: Secondary | ICD-10-CM | POA: Diagnosis not present

## 2017-07-09 DIAGNOSIS — I482 Chronic atrial fibrillation: Secondary | ICD-10-CM | POA: Diagnosis not present

## 2017-08-04 DIAGNOSIS — J439 Emphysema, unspecified: Secondary | ICD-10-CM | POA: Diagnosis not present

## 2017-08-04 DIAGNOSIS — R0600 Dyspnea, unspecified: Secondary | ICD-10-CM | POA: Diagnosis not present

## 2017-08-04 DIAGNOSIS — R911 Solitary pulmonary nodule: Secondary | ICD-10-CM | POA: Diagnosis not present

## 2017-08-04 DIAGNOSIS — J8410 Pulmonary fibrosis, unspecified: Secondary | ICD-10-CM | POA: Diagnosis not present

## 2017-08-05 DIAGNOSIS — E1142 Type 2 diabetes mellitus with diabetic polyneuropathy: Secondary | ICD-10-CM | POA: Diagnosis not present

## 2017-08-05 DIAGNOSIS — B351 Tinea unguium: Secondary | ICD-10-CM | POA: Diagnosis not present

## 2017-08-05 DIAGNOSIS — M79676 Pain in unspecified toe(s): Secondary | ICD-10-CM | POA: Diagnosis not present

## 2017-08-05 DIAGNOSIS — L84 Corns and callosities: Secondary | ICD-10-CM | POA: Diagnosis not present

## 2017-08-08 DIAGNOSIS — H6122 Impacted cerumen, left ear: Secondary | ICD-10-CM | POA: Diagnosis not present

## 2017-08-08 DIAGNOSIS — S22060A Wedge compression fracture of T7-T8 vertebra, initial encounter for closed fracture: Secondary | ICD-10-CM | POA: Diagnosis not present

## 2017-08-08 DIAGNOSIS — E119 Type 2 diabetes mellitus without complications: Secondary | ICD-10-CM | POA: Diagnosis not present

## 2017-08-08 DIAGNOSIS — I482 Chronic atrial fibrillation: Secondary | ICD-10-CM | POA: Diagnosis not present

## 2017-08-13 DIAGNOSIS — M4856XD Collapsed vertebra, not elsewhere classified, lumbar region, subsequent encounter for fracture with routine healing: Secondary | ICD-10-CM | POA: Diagnosis not present

## 2017-08-13 DIAGNOSIS — I482 Chronic atrial fibrillation: Secondary | ICD-10-CM | POA: Diagnosis not present

## 2017-08-13 DIAGNOSIS — S3992XA Unspecified injury of lower back, initial encounter: Secondary | ICD-10-CM | POA: Diagnosis not present

## 2017-08-13 DIAGNOSIS — M4854XD Collapsed vertebra, not elsewhere classified, thoracic region, subsequent encounter for fracture with routine healing: Secondary | ICD-10-CM | POA: Diagnosis not present

## 2017-08-13 DIAGNOSIS — E119 Type 2 diabetes mellitus without complications: Secondary | ICD-10-CM | POA: Diagnosis not present

## 2017-08-13 DIAGNOSIS — Z9889 Other specified postprocedural states: Secondary | ICD-10-CM | POA: Diagnosis not present

## 2017-08-13 DIAGNOSIS — M545 Low back pain: Secondary | ICD-10-CM | POA: Diagnosis not present

## 2017-08-14 DIAGNOSIS — S32020D Wedge compression fracture of second lumbar vertebra, subsequent encounter for fracture with routine healing: Secondary | ICD-10-CM | POA: Diagnosis not present

## 2017-08-18 DIAGNOSIS — Z9181 History of falling: Secondary | ICD-10-CM | POA: Diagnosis not present

## 2017-08-18 DIAGNOSIS — R293 Abnormal posture: Secondary | ICD-10-CM | POA: Diagnosis not present

## 2017-08-18 DIAGNOSIS — M6281 Muscle weakness (generalized): Secondary | ICD-10-CM | POA: Diagnosis not present

## 2017-08-18 DIAGNOSIS — R262 Difficulty in walking, not elsewhere classified: Secondary | ICD-10-CM | POA: Diagnosis not present

## 2017-08-18 DIAGNOSIS — S32020D Wedge compression fracture of second lumbar vertebra, subsequent encounter for fracture with routine healing: Secondary | ICD-10-CM | POA: Diagnosis not present

## 2017-08-19 NOTE — Progress Notes (Signed)
HPI The patient presents for evaluation of atrial fibrillation. He has had a history of SVT ablated twice around 2010 when he lived in the [place]. He had atrial fibrillation following this and had cardioversion twice.  He is now in permanent atrial fib.  Since I last saw him he has been limited by his back and joint pain.  He has also had problems with balance relying more and more on his cane.    Allergies  Allergen Reactions  . Betadine [Povidone Iodine] Rash    Current Outpatient Prescriptions  Medication Sig Dispense Refill  . albuterol (PROVENTIL) (2.5 MG/3ML) 0.083% nebulizer solution Inhale 2.5 mg into the lungs every 2 (two) hours as needed.     Marland Kitchen BIOTIN PO Take 1 capsule by mouth daily.    Marland Kitchen CRANBERRY EXTRACT PO Take 1 capsule by mouth daily.    Marland Kitchen diltiazem (CARDIZEM CD) 240 MG 24 hr capsule TAKE ONE (1) CAPSULE EACH DAY 90 capsule 3  . metFORMIN (GLUCOPHAGE) 1000 MG tablet Take 1,000 mg by mouth 2 (two) times daily with a meal.     . Multiple Vitamin (MULTIVITAMIN) tablet Take 1 tablet by mouth daily.    . Omega-3 Fatty Acids (FISH OIL) 1000 MG CAPS Take 1 capsule by mouth daily.    Marland Kitchen warfarin (COUMADIN) 5 MG tablet Take 5 mg by mouth daily.     No current facility-administered medications for this visit.     Past Medical History:  Diagnosis Date  . Atrial fibrillation ([place])   . Cardiac dysrhythmia, unspecified   . Compression fracture of L4 lumbar vertebra (HCC)   . Compression fracture of T12 vertebra (HCC)   . Diabetes mellitus without complication ([place])   . Other and unspecified hyperlipidemia   . Sleep apnea   . SVT (supraventricular tachycardia) ([place])   . Unspecified essential hypertension   . Vitamin D deficiency     Past Surgical History:  Procedure Laterality Date  . ABLATION     x2  . CARDIOVERSION     x2  . HIATAL HERNIA REPAIR     x2  . KYPHOPLASTY    . SKIN CANCER EXCISION      ROS:  As stated in the HPI and negative for all other  systems.  PHYSICAL EXAM BP 120/70   Pulse 76   Wt 198 lb (89.8 kg)   BMI 30.11 kg/m   GENERAL:  Well appearing NECK:  No jugular venous distention, waveform within normal limits, carotid upstroke brisk and symmetric, no bruits, no thyromegaly LUNGS:  Clear to auscultation bilaterally CHEST:  Unremarkable HEART:  PMI not displaced or sustained,S1 and S2 within normal limits, no S3, no clicks, no rubs, no murmurs, irregular ABD:  Flat, positive bowel sounds normal in frequency in pitch, no bruits, no rebound, no guarding, no midline pulsatile mass, no hepatomegaly, no splenomegaly EXT:  2 plus pulses throughout, no edema, no cyanosis no clubbing   EKG:  Atrial fibrillation, rate 76 , axis left axis deviation, left anterior fascicular block, anterior R wave progression, no acute ST-T wave changes.   ASSESSMENT AND PLAN  Atrial fibrillation -     Mr. Dakarri Kessinger has a CHA2DS2 - VASc score of 4 with a risk of stroke of 4% .  He has no symptoms with this.  No change in therapy is indicated.    SVT -  He has had no recurrence of this.  No change in therapy is indicated.   HTN -  The blood pressure is at target. No change in medications is indicated. We will continue with therapeutic lifestyle changes (TLC).

## 2017-08-20 ENCOUNTER — Encounter: Payer: Self-pay | Admitting: Cardiology

## 2017-08-20 ENCOUNTER — Ambulatory Visit (INDEPENDENT_AMBULATORY_CARE_PROVIDER_SITE_OTHER): Payer: Medicare Other | Admitting: Cardiology

## 2017-08-20 VITALS — BP 120/70 | HR 76 | Wt 198.0 lb

## 2017-08-20 DIAGNOSIS — I10 Essential (primary) hypertension: Secondary | ICD-10-CM

## 2017-08-20 DIAGNOSIS — I4891 Unspecified atrial fibrillation: Secondary | ICD-10-CM

## 2017-08-20 NOTE — Patient Instructions (Signed)

## 2017-08-21 ENCOUNTER — Encounter: Payer: Self-pay | Admitting: Cardiology

## 2017-08-22 DIAGNOSIS — Z9181 History of falling: Secondary | ICD-10-CM | POA: Diagnosis not present

## 2017-08-22 DIAGNOSIS — M6281 Muscle weakness (generalized): Secondary | ICD-10-CM | POA: Diagnosis not present

## 2017-08-22 DIAGNOSIS — R262 Difficulty in walking, not elsewhere classified: Secondary | ICD-10-CM | POA: Diagnosis not present

## 2017-08-22 DIAGNOSIS — R293 Abnormal posture: Secondary | ICD-10-CM | POA: Diagnosis not present

## 2017-08-22 DIAGNOSIS — S32020D Wedge compression fracture of second lumbar vertebra, subsequent encounter for fracture with routine healing: Secondary | ICD-10-CM | POA: Diagnosis not present

## 2017-08-25 DIAGNOSIS — B356 Tinea cruris: Secondary | ICD-10-CM | POA: Diagnosis not present

## 2017-08-25 DIAGNOSIS — L738 Other specified follicular disorders: Secondary | ICD-10-CM | POA: Diagnosis not present

## 2017-08-26 DIAGNOSIS — R262 Difficulty in walking, not elsewhere classified: Secondary | ICD-10-CM | POA: Diagnosis not present

## 2017-08-26 DIAGNOSIS — R293 Abnormal posture: Secondary | ICD-10-CM | POA: Diagnosis not present

## 2017-08-26 DIAGNOSIS — Z9181 History of falling: Secondary | ICD-10-CM | POA: Diagnosis not present

## 2017-08-26 DIAGNOSIS — M6281 Muscle weakness (generalized): Secondary | ICD-10-CM | POA: Diagnosis not present

## 2017-08-26 DIAGNOSIS — S32020D Wedge compression fracture of second lumbar vertebra, subsequent encounter for fracture with routine healing: Secondary | ICD-10-CM | POA: Diagnosis not present

## 2017-08-28 DIAGNOSIS — Z9181 History of falling: Secondary | ICD-10-CM | POA: Diagnosis not present

## 2017-08-28 DIAGNOSIS — M6281 Muscle weakness (generalized): Secondary | ICD-10-CM | POA: Diagnosis not present

## 2017-08-28 DIAGNOSIS — R293 Abnormal posture: Secondary | ICD-10-CM | POA: Diagnosis not present

## 2017-08-28 DIAGNOSIS — R262 Difficulty in walking, not elsewhere classified: Secondary | ICD-10-CM | POA: Diagnosis not present

## 2017-08-28 DIAGNOSIS — S32020D Wedge compression fracture of second lumbar vertebra, subsequent encounter for fracture with routine healing: Secondary | ICD-10-CM | POA: Diagnosis not present

## 2017-09-02 DIAGNOSIS — M6281 Muscle weakness (generalized): Secondary | ICD-10-CM | POA: Diagnosis not present

## 2017-09-02 DIAGNOSIS — R293 Abnormal posture: Secondary | ICD-10-CM | POA: Diagnosis not present

## 2017-09-02 DIAGNOSIS — S32020D Wedge compression fracture of second lumbar vertebra, subsequent encounter for fracture with routine healing: Secondary | ICD-10-CM | POA: Diagnosis not present

## 2017-09-02 DIAGNOSIS — R262 Difficulty in walking, not elsewhere classified: Secondary | ICD-10-CM | POA: Diagnosis not present

## 2017-09-02 DIAGNOSIS — Z9181 History of falling: Secondary | ICD-10-CM | POA: Diagnosis not present

## 2017-09-04 DIAGNOSIS — S32020D Wedge compression fracture of second lumbar vertebra, subsequent encounter for fracture with routine healing: Secondary | ICD-10-CM | POA: Diagnosis not present

## 2017-09-04 DIAGNOSIS — M6281 Muscle weakness (generalized): Secondary | ICD-10-CM | POA: Diagnosis not present

## 2017-09-04 DIAGNOSIS — Z9181 History of falling: Secondary | ICD-10-CM | POA: Diagnosis not present

## 2017-09-04 DIAGNOSIS — R293 Abnormal posture: Secondary | ICD-10-CM | POA: Diagnosis not present

## 2017-09-04 DIAGNOSIS — R262 Difficulty in walking, not elsewhere classified: Secondary | ICD-10-CM | POA: Diagnosis not present

## 2017-09-05 DIAGNOSIS — Z888 Allergy status to other drugs, medicaments and biological substances status: Secondary | ICD-10-CM | POA: Diagnosis not present

## 2017-09-05 DIAGNOSIS — R911 Solitary pulmonary nodule: Secondary | ICD-10-CM | POA: Diagnosis not present

## 2017-09-10 DIAGNOSIS — J8410 Pulmonary fibrosis, unspecified: Secondary | ICD-10-CM | POA: Diagnosis not present

## 2017-09-10 DIAGNOSIS — R911 Solitary pulmonary nodule: Secondary | ICD-10-CM | POA: Diagnosis not present

## 2017-09-10 DIAGNOSIS — R0600 Dyspnea, unspecified: Secondary | ICD-10-CM | POA: Diagnosis not present

## 2017-09-10 DIAGNOSIS — J439 Emphysema, unspecified: Secondary | ICD-10-CM | POA: Diagnosis not present

## 2017-09-15 DIAGNOSIS — I482 Chronic atrial fibrillation: Secondary | ICD-10-CM | POA: Diagnosis not present

## 2017-09-15 DIAGNOSIS — E119 Type 2 diabetes mellitus without complications: Secondary | ICD-10-CM | POA: Diagnosis not present

## 2017-09-15 DIAGNOSIS — J441 Chronic obstructive pulmonary disease with (acute) exacerbation: Secondary | ICD-10-CM | POA: Diagnosis not present

## 2017-09-16 DIAGNOSIS — M6281 Muscle weakness (generalized): Secondary | ICD-10-CM | POA: Diagnosis not present

## 2017-09-16 DIAGNOSIS — Z9181 History of falling: Secondary | ICD-10-CM | POA: Diagnosis not present

## 2017-09-16 DIAGNOSIS — R262 Difficulty in walking, not elsewhere classified: Secondary | ICD-10-CM | POA: Diagnosis not present

## 2017-09-16 DIAGNOSIS — R293 Abnormal posture: Secondary | ICD-10-CM | POA: Diagnosis not present

## 2017-09-16 DIAGNOSIS — S32020D Wedge compression fracture of second lumbar vertebra, subsequent encounter for fracture with routine healing: Secondary | ICD-10-CM | POA: Diagnosis not present

## 2017-09-18 DIAGNOSIS — R262 Difficulty in walking, not elsewhere classified: Secondary | ICD-10-CM | POA: Diagnosis not present

## 2017-09-18 DIAGNOSIS — M6281 Muscle weakness (generalized): Secondary | ICD-10-CM | POA: Diagnosis not present

## 2017-09-18 DIAGNOSIS — Z9181 History of falling: Secondary | ICD-10-CM | POA: Diagnosis not present

## 2017-09-18 DIAGNOSIS — R293 Abnormal posture: Secondary | ICD-10-CM | POA: Diagnosis not present

## 2017-09-23 DIAGNOSIS — R293 Abnormal posture: Secondary | ICD-10-CM | POA: Diagnosis not present

## 2017-09-23 DIAGNOSIS — Z9181 History of falling: Secondary | ICD-10-CM | POA: Diagnosis not present

## 2017-09-23 DIAGNOSIS — R262 Difficulty in walking, not elsewhere classified: Secondary | ICD-10-CM | POA: Diagnosis not present

## 2017-09-23 DIAGNOSIS — M6281 Muscle weakness (generalized): Secondary | ICD-10-CM | POA: Diagnosis not present

## 2017-09-25 DIAGNOSIS — R293 Abnormal posture: Secondary | ICD-10-CM | POA: Diagnosis not present

## 2017-09-25 DIAGNOSIS — R262 Difficulty in walking, not elsewhere classified: Secondary | ICD-10-CM | POA: Diagnosis not present

## 2017-09-25 DIAGNOSIS — M6281 Muscle weakness (generalized): Secondary | ICD-10-CM | POA: Diagnosis not present

## 2017-09-25 DIAGNOSIS — Z9181 History of falling: Secondary | ICD-10-CM | POA: Diagnosis not present

## 2017-09-30 DIAGNOSIS — R262 Difficulty in walking, not elsewhere classified: Secondary | ICD-10-CM | POA: Diagnosis not present

## 2017-09-30 DIAGNOSIS — R293 Abnormal posture: Secondary | ICD-10-CM | POA: Diagnosis not present

## 2017-09-30 DIAGNOSIS — M6281 Muscle weakness (generalized): Secondary | ICD-10-CM | POA: Diagnosis not present

## 2017-09-30 DIAGNOSIS — Z9181 History of falling: Secondary | ICD-10-CM | POA: Diagnosis not present

## 2017-09-30 DIAGNOSIS — M48062 Spinal stenosis, lumbar region with neurogenic claudication: Secondary | ICD-10-CM | POA: Diagnosis not present

## 2017-10-01 DIAGNOSIS — Z7901 Long term (current) use of anticoagulants: Secondary | ICD-10-CM | POA: Diagnosis not present

## 2017-10-07 DIAGNOSIS — M7742 Metatarsalgia, left foot: Secondary | ICD-10-CM | POA: Diagnosis not present

## 2017-10-07 DIAGNOSIS — M79671 Pain in right foot: Secondary | ICD-10-CM | POA: Diagnosis not present

## 2017-10-13 DIAGNOSIS — Z87311 Personal history of (healed) other pathological fracture: Secondary | ICD-10-CM | POA: Diagnosis not present

## 2017-10-13 DIAGNOSIS — R296 Repeated falls: Secondary | ICD-10-CM | POA: Diagnosis not present

## 2017-10-13 DIAGNOSIS — M48062 Spinal stenosis, lumbar region with neurogenic claudication: Secondary | ICD-10-CM | POA: Diagnosis not present

## 2017-10-13 DIAGNOSIS — M5126 Other intervertebral disc displacement, lumbar region: Secondary | ICD-10-CM | POA: Diagnosis not present

## 2017-10-13 DIAGNOSIS — I700 Atherosclerosis of aorta: Secondary | ICD-10-CM | POA: Diagnosis not present

## 2017-10-16 DIAGNOSIS — I482 Chronic atrial fibrillation: Secondary | ICD-10-CM | POA: Diagnosis not present

## 2017-10-16 DIAGNOSIS — J441 Chronic obstructive pulmonary disease with (acute) exacerbation: Secondary | ICD-10-CM | POA: Diagnosis not present

## 2017-10-16 DIAGNOSIS — E119 Type 2 diabetes mellitus without complications: Secondary | ICD-10-CM | POA: Diagnosis not present

## 2017-11-06 DIAGNOSIS — M48062 Spinal stenosis, lumbar region with neurogenic claudication: Secondary | ICD-10-CM | POA: Diagnosis not present

## 2017-11-13 DIAGNOSIS — I482 Chronic atrial fibrillation: Secondary | ICD-10-CM | POA: Diagnosis not present

## 2017-11-13 DIAGNOSIS — J441 Chronic obstructive pulmonary disease with (acute) exacerbation: Secondary | ICD-10-CM | POA: Diagnosis not present

## 2017-11-13 DIAGNOSIS — E119 Type 2 diabetes mellitus without complications: Secondary | ICD-10-CM | POA: Diagnosis not present

## 2017-11-18 ENCOUNTER — Other Ambulatory Visit: Payer: Self-pay | Admitting: Cardiology

## 2017-11-19 NOTE — Telephone Encounter (Signed)
Rx has been sent to the pharmacy electronically. ° °

## 2017-11-26 DIAGNOSIS — Z7901 Long term (current) use of anticoagulants: Secondary | ICD-10-CM | POA: Diagnosis not present

## 2017-12-03 DIAGNOSIS — I482 Chronic atrial fibrillation: Secondary | ICD-10-CM | POA: Diagnosis not present

## 2017-12-03 DIAGNOSIS — E119 Type 2 diabetes mellitus without complications: Secondary | ICD-10-CM | POA: Diagnosis not present

## 2017-12-03 DIAGNOSIS — S22060A Wedge compression fracture of T7-T8 vertebra, initial encounter for closed fracture: Secondary | ICD-10-CM | POA: Diagnosis not present

## 2017-12-08 DIAGNOSIS — I482 Chronic atrial fibrillation: Secondary | ICD-10-CM | POA: Diagnosis not present

## 2017-12-08 DIAGNOSIS — Z808 Family history of malignant neoplasm of other organs or systems: Secondary | ICD-10-CM | POA: Diagnosis not present

## 2017-12-08 DIAGNOSIS — M8088XA Other osteoporosis with current pathological fracture, vertebra(e), initial encounter for fracture: Secondary | ICD-10-CM | POA: Diagnosis not present

## 2017-12-08 DIAGNOSIS — Z7984 Long term (current) use of oral hypoglycemic drugs: Secondary | ICD-10-CM | POA: Diagnosis not present

## 2017-12-08 DIAGNOSIS — Z89421 Acquired absence of other right toe(s): Secondary | ICD-10-CM | POA: Diagnosis not present

## 2017-12-08 DIAGNOSIS — I10 Essential (primary) hypertension: Secondary | ICD-10-CM | POA: Diagnosis not present

## 2017-12-08 DIAGNOSIS — J984 Other disorders of lung: Secondary | ICD-10-CM | POA: Diagnosis not present

## 2017-12-08 DIAGNOSIS — M6281 Muscle weakness (generalized): Secondary | ICD-10-CM | POA: Diagnosis not present

## 2017-12-08 DIAGNOSIS — G8929 Other chronic pain: Secondary | ICD-10-CM | POA: Diagnosis not present

## 2017-12-08 DIAGNOSIS — Z87891 Personal history of nicotine dependence: Secondary | ICD-10-CM | POA: Diagnosis not present

## 2017-12-08 DIAGNOSIS — J449 Chronic obstructive pulmonary disease, unspecified: Secondary | ICD-10-CM | POA: Diagnosis not present

## 2017-12-08 DIAGNOSIS — Z96649 Presence of unspecified artificial hip joint: Secondary | ICD-10-CM | POA: Diagnosis not present

## 2017-12-08 DIAGNOSIS — K219 Gastro-esophageal reflux disease without esophagitis: Secondary | ICD-10-CM | POA: Diagnosis not present

## 2017-12-08 DIAGNOSIS — E119 Type 2 diabetes mellitus without complications: Secondary | ICD-10-CM | POA: Diagnosis not present

## 2017-12-08 DIAGNOSIS — Z85828 Personal history of other malignant neoplasm of skin: Secondary | ICD-10-CM | POA: Diagnosis not present

## 2017-12-08 DIAGNOSIS — M48062 Spinal stenosis, lumbar region with neurogenic claudication: Secondary | ICD-10-CM | POA: Diagnosis not present

## 2017-12-08 DIAGNOSIS — R262 Difficulty in walking, not elsewhere classified: Secondary | ICD-10-CM | POA: Diagnosis not present

## 2017-12-08 DIAGNOSIS — Z7901 Long term (current) use of anticoagulants: Secondary | ICD-10-CM | POA: Diagnosis not present

## 2017-12-08 DIAGNOSIS — Z79899 Other long term (current) drug therapy: Secondary | ICD-10-CM | POA: Diagnosis not present

## 2017-12-08 DIAGNOSIS — Z888 Allergy status to other drugs, medicaments and biological substances status: Secondary | ICD-10-CM | POA: Diagnosis not present

## 2017-12-08 DIAGNOSIS — Z836 Family history of other diseases of the respiratory system: Secondary | ICD-10-CM | POA: Diagnosis not present

## 2017-12-08 DIAGNOSIS — M47816 Spondylosis without myelopathy or radiculopathy, lumbar region: Secondary | ICD-10-CM | POA: Diagnosis not present

## 2017-12-08 DIAGNOSIS — Z8262 Family history of osteoporosis: Secondary | ICD-10-CM | POA: Diagnosis not present

## 2017-12-08 DIAGNOSIS — M545 Low back pain: Secondary | ICD-10-CM | POA: Diagnosis not present

## 2017-12-10 DIAGNOSIS — M545 Low back pain: Secondary | ICD-10-CM | POA: Diagnosis not present

## 2017-12-10 DIAGNOSIS — I482 Chronic atrial fibrillation: Secondary | ICD-10-CM | POA: Diagnosis not present

## 2017-12-10 DIAGNOSIS — E119 Type 2 diabetes mellitus without complications: Secondary | ICD-10-CM | POA: Diagnosis not present

## 2017-12-10 DIAGNOSIS — M961 Postlaminectomy syndrome, not elsewhere classified: Secondary | ICD-10-CM | POA: Diagnosis not present

## 2017-12-10 DIAGNOSIS — M48062 Spinal stenosis, lumbar region with neurogenic claudication: Secondary | ICD-10-CM | POA: Diagnosis not present

## 2017-12-10 DIAGNOSIS — M47816 Spondylosis without myelopathy or radiculopathy, lumbar region: Secondary | ICD-10-CM | POA: Diagnosis not present

## 2017-12-10 DIAGNOSIS — J449 Chronic obstructive pulmonary disease, unspecified: Secondary | ICD-10-CM | POA: Diagnosis not present

## 2017-12-11 DIAGNOSIS — E119 Type 2 diabetes mellitus without complications: Secondary | ICD-10-CM | POA: Diagnosis not present

## 2017-12-11 DIAGNOSIS — M47816 Spondylosis without myelopathy or radiculopathy, lumbar region: Secondary | ICD-10-CM | POA: Diagnosis not present

## 2017-12-11 DIAGNOSIS — M48062 Spinal stenosis, lumbar region with neurogenic claudication: Secondary | ICD-10-CM | POA: Diagnosis not present

## 2017-12-11 DIAGNOSIS — J441 Chronic obstructive pulmonary disease with (acute) exacerbation: Secondary | ICD-10-CM | POA: Diagnosis not present

## 2017-12-11 DIAGNOSIS — M545 Low back pain: Secondary | ICD-10-CM | POA: Diagnosis not present

## 2017-12-11 DIAGNOSIS — I482 Chronic atrial fibrillation: Secondary | ICD-10-CM | POA: Diagnosis not present

## 2017-12-11 DIAGNOSIS — J449 Chronic obstructive pulmonary disease, unspecified: Secondary | ICD-10-CM | POA: Diagnosis not present

## 2017-12-12 DIAGNOSIS — I482 Chronic atrial fibrillation: Secondary | ICD-10-CM | POA: Diagnosis not present

## 2017-12-12 DIAGNOSIS — E119 Type 2 diabetes mellitus without complications: Secondary | ICD-10-CM | POA: Diagnosis not present

## 2017-12-12 DIAGNOSIS — M545 Low back pain: Secondary | ICD-10-CM | POA: Diagnosis not present

## 2017-12-12 DIAGNOSIS — J449 Chronic obstructive pulmonary disease, unspecified: Secondary | ICD-10-CM | POA: Diagnosis not present

## 2017-12-12 DIAGNOSIS — M48062 Spinal stenosis, lumbar region with neurogenic claudication: Secondary | ICD-10-CM | POA: Diagnosis not present

## 2017-12-12 DIAGNOSIS — M47816 Spondylosis without myelopathy or radiculopathy, lumbar region: Secondary | ICD-10-CM | POA: Diagnosis not present

## 2017-12-15 DIAGNOSIS — Z7901 Long term (current) use of anticoagulants: Secondary | ICD-10-CM | POA: Diagnosis not present

## 2017-12-15 DIAGNOSIS — E119 Type 2 diabetes mellitus without complications: Secondary | ICD-10-CM | POA: Diagnosis not present

## 2017-12-15 DIAGNOSIS — I482 Chronic atrial fibrillation: Secondary | ICD-10-CM | POA: Diagnosis not present

## 2017-12-15 DIAGNOSIS — J449 Chronic obstructive pulmonary disease, unspecified: Secondary | ICD-10-CM | POA: Diagnosis not present

## 2017-12-15 DIAGNOSIS — Z4789 Encounter for other orthopedic aftercare: Secondary | ICD-10-CM | POA: Diagnosis not present

## 2017-12-15 DIAGNOSIS — M48061 Spinal stenosis, lumbar region without neurogenic claudication: Secondary | ICD-10-CM | POA: Diagnosis not present

## 2017-12-15 DIAGNOSIS — M810 Age-related osteoporosis without current pathological fracture: Secondary | ICD-10-CM | POA: Diagnosis not present

## 2017-12-15 DIAGNOSIS — Z7984 Long term (current) use of oral hypoglycemic drugs: Secondary | ICD-10-CM | POA: Diagnosis not present

## 2017-12-18 DIAGNOSIS — M48061 Spinal stenosis, lumbar region without neurogenic claudication: Secondary | ICD-10-CM | POA: Insufficient documentation

## 2017-12-18 DIAGNOSIS — M47816 Spondylosis without myelopathy or radiculopathy, lumbar region: Secondary | ICD-10-CM | POA: Insufficient documentation

## 2017-12-19 DIAGNOSIS — M810 Age-related osteoporosis without current pathological fracture: Secondary | ICD-10-CM | POA: Diagnosis not present

## 2017-12-19 DIAGNOSIS — E119 Type 2 diabetes mellitus without complications: Secondary | ICD-10-CM | POA: Diagnosis not present

## 2017-12-19 DIAGNOSIS — M48061 Spinal stenosis, lumbar region without neurogenic claudication: Secondary | ICD-10-CM | POA: Diagnosis not present

## 2017-12-19 DIAGNOSIS — Z4789 Encounter for other orthopedic aftercare: Secondary | ICD-10-CM | POA: Diagnosis not present

## 2017-12-19 DIAGNOSIS — J449 Chronic obstructive pulmonary disease, unspecified: Secondary | ICD-10-CM | POA: Diagnosis not present

## 2017-12-19 DIAGNOSIS — I482 Chronic atrial fibrillation: Secondary | ICD-10-CM | POA: Diagnosis not present

## 2017-12-22 DIAGNOSIS — I482 Chronic atrial fibrillation: Secondary | ICD-10-CM | POA: Diagnosis not present

## 2017-12-22 DIAGNOSIS — M48061 Spinal stenosis, lumbar region without neurogenic claudication: Secondary | ICD-10-CM | POA: Diagnosis not present

## 2017-12-22 DIAGNOSIS — M810 Age-related osteoporosis without current pathological fracture: Secondary | ICD-10-CM | POA: Diagnosis not present

## 2017-12-22 DIAGNOSIS — J449 Chronic obstructive pulmonary disease, unspecified: Secondary | ICD-10-CM | POA: Diagnosis not present

## 2017-12-22 DIAGNOSIS — Z4789 Encounter for other orthopedic aftercare: Secondary | ICD-10-CM | POA: Diagnosis not present

## 2017-12-22 DIAGNOSIS — E119 Type 2 diabetes mellitus without complications: Secondary | ICD-10-CM | POA: Diagnosis not present

## 2017-12-24 DIAGNOSIS — Z4789 Encounter for other orthopedic aftercare: Secondary | ICD-10-CM | POA: Diagnosis not present

## 2017-12-24 DIAGNOSIS — I482 Chronic atrial fibrillation: Secondary | ICD-10-CM | POA: Diagnosis not present

## 2017-12-24 DIAGNOSIS — M810 Age-related osteoporosis without current pathological fracture: Secondary | ICD-10-CM | POA: Diagnosis not present

## 2017-12-24 DIAGNOSIS — E119 Type 2 diabetes mellitus without complications: Secondary | ICD-10-CM | POA: Diagnosis not present

## 2017-12-24 DIAGNOSIS — J449 Chronic obstructive pulmonary disease, unspecified: Secondary | ICD-10-CM | POA: Diagnosis not present

## 2017-12-24 DIAGNOSIS — M48061 Spinal stenosis, lumbar region without neurogenic claudication: Secondary | ICD-10-CM | POA: Diagnosis not present

## 2017-12-26 DIAGNOSIS — M810 Age-related osteoporosis without current pathological fracture: Secondary | ICD-10-CM | POA: Diagnosis not present

## 2017-12-26 DIAGNOSIS — E119 Type 2 diabetes mellitus without complications: Secondary | ICD-10-CM | POA: Diagnosis not present

## 2017-12-26 DIAGNOSIS — M48061 Spinal stenosis, lumbar region without neurogenic claudication: Secondary | ICD-10-CM | POA: Diagnosis not present

## 2017-12-26 DIAGNOSIS — Z4789 Encounter for other orthopedic aftercare: Secondary | ICD-10-CM | POA: Diagnosis not present

## 2017-12-26 DIAGNOSIS — I482 Chronic atrial fibrillation: Secondary | ICD-10-CM | POA: Diagnosis not present

## 2017-12-26 DIAGNOSIS — J449 Chronic obstructive pulmonary disease, unspecified: Secondary | ICD-10-CM | POA: Diagnosis not present

## 2017-12-29 DIAGNOSIS — Z4789 Encounter for other orthopedic aftercare: Secondary | ICD-10-CM | POA: Diagnosis not present

## 2017-12-29 DIAGNOSIS — I482 Chronic atrial fibrillation: Secondary | ICD-10-CM | POA: Diagnosis not present

## 2017-12-29 DIAGNOSIS — E119 Type 2 diabetes mellitus without complications: Secondary | ICD-10-CM | POA: Diagnosis not present

## 2017-12-29 DIAGNOSIS — J449 Chronic obstructive pulmonary disease, unspecified: Secondary | ICD-10-CM | POA: Diagnosis not present

## 2017-12-29 DIAGNOSIS — M48061 Spinal stenosis, lumbar region without neurogenic claudication: Secondary | ICD-10-CM | POA: Diagnosis not present

## 2017-12-29 DIAGNOSIS — M810 Age-related osteoporosis without current pathological fracture: Secondary | ICD-10-CM | POA: Diagnosis not present

## 2017-12-30 DIAGNOSIS — M79676 Pain in unspecified toe(s): Secondary | ICD-10-CM | POA: Diagnosis not present

## 2017-12-30 DIAGNOSIS — B351 Tinea unguium: Secondary | ICD-10-CM | POA: Diagnosis not present

## 2017-12-30 DIAGNOSIS — L84 Corns and callosities: Secondary | ICD-10-CM | POA: Diagnosis not present

## 2017-12-30 DIAGNOSIS — E1142 Type 2 diabetes mellitus with diabetic polyneuropathy: Secondary | ICD-10-CM | POA: Diagnosis not present

## 2018-01-01 DIAGNOSIS — L570 Actinic keratosis: Secondary | ICD-10-CM | POA: Diagnosis not present

## 2018-01-01 DIAGNOSIS — C4441 Basal cell carcinoma of skin of scalp and neck: Secondary | ICD-10-CM | POA: Diagnosis not present

## 2018-01-01 DIAGNOSIS — X32XXXD Exposure to sunlight, subsequent encounter: Secondary | ICD-10-CM | POA: Diagnosis not present

## 2018-01-02 DIAGNOSIS — Z4789 Encounter for other orthopedic aftercare: Secondary | ICD-10-CM | POA: Diagnosis not present

## 2018-01-02 DIAGNOSIS — M810 Age-related osteoporosis without current pathological fracture: Secondary | ICD-10-CM | POA: Diagnosis not present

## 2018-01-02 DIAGNOSIS — M48061 Spinal stenosis, lumbar region without neurogenic claudication: Secondary | ICD-10-CM | POA: Diagnosis not present

## 2018-01-02 DIAGNOSIS — E119 Type 2 diabetes mellitus without complications: Secondary | ICD-10-CM | POA: Diagnosis not present

## 2018-01-02 DIAGNOSIS — J449 Chronic obstructive pulmonary disease, unspecified: Secondary | ICD-10-CM | POA: Diagnosis not present

## 2018-01-02 DIAGNOSIS — I482 Chronic atrial fibrillation: Secondary | ICD-10-CM | POA: Diagnosis not present

## 2018-01-05 DIAGNOSIS — M48061 Spinal stenosis, lumbar region without neurogenic claudication: Secondary | ICD-10-CM | POA: Diagnosis not present

## 2018-01-05 DIAGNOSIS — J449 Chronic obstructive pulmonary disease, unspecified: Secondary | ICD-10-CM | POA: Diagnosis not present

## 2018-01-05 DIAGNOSIS — I482 Chronic atrial fibrillation: Secondary | ICD-10-CM | POA: Diagnosis not present

## 2018-01-05 DIAGNOSIS — Z4789 Encounter for other orthopedic aftercare: Secondary | ICD-10-CM | POA: Diagnosis not present

## 2018-01-05 DIAGNOSIS — M810 Age-related osteoporosis without current pathological fracture: Secondary | ICD-10-CM | POA: Diagnosis not present

## 2018-01-05 DIAGNOSIS — E119 Type 2 diabetes mellitus without complications: Secondary | ICD-10-CM | POA: Diagnosis not present

## 2018-01-09 DIAGNOSIS — Z4789 Encounter for other orthopedic aftercare: Secondary | ICD-10-CM | POA: Diagnosis not present

## 2018-01-09 DIAGNOSIS — M810 Age-related osteoporosis without current pathological fracture: Secondary | ICD-10-CM | POA: Diagnosis not present

## 2018-01-09 DIAGNOSIS — M48061 Spinal stenosis, lumbar region without neurogenic claudication: Secondary | ICD-10-CM | POA: Diagnosis not present

## 2018-01-09 DIAGNOSIS — J449 Chronic obstructive pulmonary disease, unspecified: Secondary | ICD-10-CM | POA: Diagnosis not present

## 2018-01-09 DIAGNOSIS — I482 Chronic atrial fibrillation: Secondary | ICD-10-CM | POA: Diagnosis not present

## 2018-01-09 DIAGNOSIS — E119 Type 2 diabetes mellitus without complications: Secondary | ICD-10-CM | POA: Diagnosis not present

## 2018-01-20 DIAGNOSIS — I482 Chronic atrial fibrillation: Secondary | ICD-10-CM | POA: Diagnosis not present

## 2018-01-20 DIAGNOSIS — J441 Chronic obstructive pulmonary disease with (acute) exacerbation: Secondary | ICD-10-CM | POA: Diagnosis not present

## 2018-01-20 DIAGNOSIS — E119 Type 2 diabetes mellitus without complications: Secondary | ICD-10-CM | POA: Diagnosis not present

## 2018-02-13 DIAGNOSIS — S22060A Wedge compression fracture of T7-T8 vertebra, initial encounter for closed fracture: Secondary | ICD-10-CM | POA: Diagnosis not present

## 2018-02-13 DIAGNOSIS — E119 Type 2 diabetes mellitus without complications: Secondary | ICD-10-CM | POA: Diagnosis not present

## 2018-02-13 DIAGNOSIS — I482 Chronic atrial fibrillation: Secondary | ICD-10-CM | POA: Diagnosis not present

## 2018-02-17 DIAGNOSIS — I482 Chronic atrial fibrillation: Secondary | ICD-10-CM | POA: Diagnosis not present

## 2018-02-17 DIAGNOSIS — Z87891 Personal history of nicotine dependence: Secondary | ICD-10-CM | POA: Diagnosis not present

## 2018-02-17 DIAGNOSIS — J449 Chronic obstructive pulmonary disease, unspecified: Secondary | ICD-10-CM | POA: Diagnosis not present

## 2018-02-17 DIAGNOSIS — Z7984 Long term (current) use of oral hypoglycemic drugs: Secondary | ICD-10-CM | POA: Diagnosis not present

## 2018-02-17 DIAGNOSIS — M48062 Spinal stenosis, lumbar region with neurogenic claudication: Secondary | ICD-10-CM | POA: Diagnosis not present

## 2018-02-17 DIAGNOSIS — Z4789 Encounter for other orthopedic aftercare: Secondary | ICD-10-CM | POA: Diagnosis not present

## 2018-02-17 DIAGNOSIS — M810 Age-related osteoporosis without current pathological fracture: Secondary | ICD-10-CM | POA: Diagnosis not present

## 2018-02-17 DIAGNOSIS — E119 Type 2 diabetes mellitus without complications: Secondary | ICD-10-CM | POA: Diagnosis not present

## 2018-02-17 DIAGNOSIS — Z7901 Long term (current) use of anticoagulants: Secondary | ICD-10-CM | POA: Diagnosis not present

## 2018-02-19 DIAGNOSIS — I482 Chronic atrial fibrillation: Secondary | ICD-10-CM | POA: Diagnosis not present

## 2018-02-19 DIAGNOSIS — Z4789 Encounter for other orthopedic aftercare: Secondary | ICD-10-CM | POA: Diagnosis not present

## 2018-02-19 DIAGNOSIS — E119 Type 2 diabetes mellitus without complications: Secondary | ICD-10-CM | POA: Diagnosis not present

## 2018-02-19 DIAGNOSIS — M48062 Spinal stenosis, lumbar region with neurogenic claudication: Secondary | ICD-10-CM | POA: Diagnosis not present

## 2018-02-19 DIAGNOSIS — M810 Age-related osteoporosis without current pathological fracture: Secondary | ICD-10-CM | POA: Diagnosis not present

## 2018-02-19 DIAGNOSIS — J449 Chronic obstructive pulmonary disease, unspecified: Secondary | ICD-10-CM | POA: Diagnosis not present

## 2018-02-23 DIAGNOSIS — E119 Type 2 diabetes mellitus without complications: Secondary | ICD-10-CM | POA: Diagnosis not present

## 2018-02-23 DIAGNOSIS — Z4789 Encounter for other orthopedic aftercare: Secondary | ICD-10-CM | POA: Diagnosis not present

## 2018-02-23 DIAGNOSIS — M48062 Spinal stenosis, lumbar region with neurogenic claudication: Secondary | ICD-10-CM | POA: Diagnosis not present

## 2018-02-23 DIAGNOSIS — M810 Age-related osteoporosis without current pathological fracture: Secondary | ICD-10-CM | POA: Diagnosis not present

## 2018-02-23 DIAGNOSIS — I482 Chronic atrial fibrillation: Secondary | ICD-10-CM | POA: Diagnosis not present

## 2018-02-23 DIAGNOSIS — J449 Chronic obstructive pulmonary disease, unspecified: Secondary | ICD-10-CM | POA: Diagnosis not present

## 2018-02-26 DIAGNOSIS — M810 Age-related osteoporosis without current pathological fracture: Secondary | ICD-10-CM | POA: Diagnosis not present

## 2018-02-26 DIAGNOSIS — E119 Type 2 diabetes mellitus without complications: Secondary | ICD-10-CM | POA: Diagnosis not present

## 2018-02-26 DIAGNOSIS — I482 Chronic atrial fibrillation: Secondary | ICD-10-CM | POA: Diagnosis not present

## 2018-02-26 DIAGNOSIS — M48062 Spinal stenosis, lumbar region with neurogenic claudication: Secondary | ICD-10-CM | POA: Diagnosis not present

## 2018-02-26 DIAGNOSIS — J449 Chronic obstructive pulmonary disease, unspecified: Secondary | ICD-10-CM | POA: Diagnosis not present

## 2018-02-26 DIAGNOSIS — Z4789 Encounter for other orthopedic aftercare: Secondary | ICD-10-CM | POA: Diagnosis not present

## 2018-03-04 DIAGNOSIS — J449 Chronic obstructive pulmonary disease, unspecified: Secondary | ICD-10-CM | POA: Diagnosis not present

## 2018-03-04 DIAGNOSIS — R911 Solitary pulmonary nodule: Secondary | ICD-10-CM | POA: Diagnosis not present

## 2018-03-04 DIAGNOSIS — R918 Other nonspecific abnormal finding of lung field: Secondary | ICD-10-CM | POA: Diagnosis not present

## 2018-03-09 DIAGNOSIS — Z96641 Presence of right artificial hip joint: Secondary | ICD-10-CM | POA: Diagnosis not present

## 2018-03-09 DIAGNOSIS — Z87311 Personal history of (healed) other pathological fracture: Secondary | ICD-10-CM | POA: Diagnosis not present

## 2018-03-09 DIAGNOSIS — M47816 Spondylosis without myelopathy or radiculopathy, lumbar region: Secondary | ICD-10-CM | POA: Diagnosis not present

## 2018-03-09 DIAGNOSIS — M545 Low back pain: Secondary | ICD-10-CM | POA: Diagnosis not present

## 2018-03-09 DIAGNOSIS — I700 Atherosclerosis of aorta: Secondary | ICD-10-CM | POA: Diagnosis not present

## 2018-03-09 DIAGNOSIS — M25551 Pain in right hip: Secondary | ICD-10-CM | POA: Diagnosis not present

## 2018-03-11 DIAGNOSIS — R918 Other nonspecific abnormal finding of lung field: Secondary | ICD-10-CM | POA: Diagnosis not present

## 2018-03-11 DIAGNOSIS — R0600 Dyspnea, unspecified: Secondary | ICD-10-CM | POA: Diagnosis not present

## 2018-03-12 DIAGNOSIS — M79671 Pain in right foot: Secondary | ICD-10-CM | POA: Diagnosis not present

## 2018-03-12 DIAGNOSIS — M76821 Posterior tibial tendinitis, right leg: Secondary | ICD-10-CM | POA: Diagnosis not present

## 2018-03-24 ENCOUNTER — Encounter: Payer: Self-pay | Admitting: Cardiology

## 2018-03-24 NOTE — Progress Notes (Signed)
HPI The patient presents for evaluation of atrial fibrillation. He has had a history of SVT ablated twice around 2010 when he lived in the [place]. He had atrial fibrillation following this and had cardioversion twice.  He is now in permanent atrial fib.  Since I last saw him he had a lumbar laminectomy.  This was at [facility] he said he initially did very well with this.  He was at [facility]  Doing well with his physical therapy.  He was ambulating.  However, in the last few weeks he has had a significant decline with diffuse leg weakness and generalized weakness.  He had some shortness of breath and I was able to review some outside pulmonary records and he had a CT without acute findings.  He has had some failure of the back wound to heal completely.  However, he has not had any fevers or chills or sweats.  He has not had any back pain.  His wife is a wound nurse who says there has been no purulent exudate.  It is serosanguineous.  There is been no wound tenderness.  He has not noticed any new palpitations, presyncope or syncope.  He has not had any new chest pressure, neck or arm discomfort.    Allergies  Allergen Reactions  . Betadine [Povidone Iodine] Rash    Current Outpatient Medications  Medication Sig Dispense Refill  . albuterol (PROVENTIL) (2.5 MG/3ML) 0.083% nebulizer solution Inhale 2.5 mg into the lungs every 2 (two) hours as needed.     Marland Kitchen BIOTIN PO Take 1 capsule by mouth daily.    Marland Kitchen CRANBERRY EXTRACT PO Take 1 capsule by mouth daily.    Marland Kitchen diltiazem (CARDIZEM CD) 240 MG 24 hr capsule TAKE ONE (1) CAPSULE EACH DAY 90 capsule 1  . furosemide (LASIX) 40 MG tablet Take 40 mg by mouth daily.    . metFORMIN (GLUCOPHAGE) 1000 MG tablet Take 1,000 mg by mouth 2 (two) times daily with a meal.     . Multiple Vitamin (MULTIVITAMIN) tablet Take 1 tablet by mouth daily.    . Omega-3 Fatty Acids (FISH OIL) 1000 MG CAPS Take 1 capsule by mouth daily.    Marland Kitchen warfarin (COUMADIN) 5 MG tablet Take 5 mg  by mouth daily.     No current facility-administered medications for this visit.     Past Medical History:  Diagnosis Date  . Atrial fibrillation ([place])   . Cardiac dysrhythmia, unspecified   . Compression fracture of L4 lumbar vertebra   . Compression fracture of T12 vertebra (HCC)   . Diabetes mellitus without complication ([place])   . Other and unspecified hyperlipidemia   . Sleep apnea   . SVT (supraventricular tachycardia) ([place])   . Unspecified essential hypertension   . Vitamin D deficiency     Past Surgical History:  Procedure Laterality Date  . ABLATION     x2  . CARDIOVERSION     x2  . HIATAL HERNIA REPAIR     x2  . KYPHOPLASTY    . SKIN CANCER EXCISION      ROS:  Decreased appetite, weight loss.  Otherwise as stated in the HPI and negative for all other systems.  PHYSICAL EXAM BP 118/68   Pulse 88   Ht 5' 8.5" (1.74 m)   BMI 29.67 kg/m   GENERAL:  Well appearing NECK:  No jugular venous distention, waveform within normal limits, carotid upstroke brisk and symmetric, no bruits, no thyromegaly LUNGS:  Clear to  auscultation bilaterally CHEST:  Unremarkable HEART:  PMI not displaced or sustained,S1 and S2 within normal limits, no S3, no clicks, no rubs, no murmurs, irregular ABD:  Flat, positive bowel sounds normal in frequency in pitch, no bruits, no rebound, no guarding, no midline pulsatile mass, no hepatomegaly, no splenomegaly EXT:  2 plus pulses throughout, no edema, no cyanosis no clubbing   EKG:  Atrial fibrillation, rate 88, axis left axis deviation, left anterior fascicular block, anterior R wave progression, no acute ST-T wave changes.   ASSESSMENT AND PLAN  Atrial fibrillation -     Jeremy Vargas has a CHA2DS2 - VASc score of 4 with a risk of stroke of 4% .  No change in therapy.    SVT -  He has had no recurrence of this.   HTN -  The blood pressure is at target. No change in medications is indicated. We will continue with therapeutic  lifestyle changes (TLC).  FAILURE TO THRIVE - It is not clear why he has had this.  He has been unable to get in to see his primary provider and would like to switch.  He needs some blood work which has not been done since his progressive weakness has started.  I will be happy to order a CBC, c-Met, TSH and although he has no symptoms a UA.  I will try to get him in to see a new primary provider for this work-up.  I did discuss the possibility of infection since he had recent surgery and that is less likely without fevers or any tenderness and his wife is taking excellent care of.

## 2018-03-25 ENCOUNTER — Ambulatory Visit (INDEPENDENT_AMBULATORY_CARE_PROVIDER_SITE_OTHER): Payer: Medicare Other | Admitting: Cardiology

## 2018-03-25 ENCOUNTER — Encounter: Payer: Self-pay | Admitting: Cardiology

## 2018-03-25 VITALS — BP 118/68 | HR 88 | Ht 68.5 in

## 2018-03-25 DIAGNOSIS — Z79899 Other long term (current) drug therapy: Secondary | ICD-10-CM | POA: Diagnosis not present

## 2018-03-25 DIAGNOSIS — I482 Chronic atrial fibrillation: Secondary | ICD-10-CM | POA: Diagnosis not present

## 2018-03-25 DIAGNOSIS — I4821 Permanent atrial fibrillation: Secondary | ICD-10-CM

## 2018-03-25 DIAGNOSIS — R627 Adult failure to thrive: Secondary | ICD-10-CM

## 2018-03-25 DIAGNOSIS — I10 Essential (primary) hypertension: Secondary | ICD-10-CM | POA: Diagnosis not present

## 2018-03-25 NOTE — Patient Instructions (Signed)
Medication Instructions:  The current medical regimen is effective;  continue present plan and medications.  Labwork: Please have lab work completed at [facility]. (CBC, CMP, TSH and u/a)  Follow-Up: Follow up as instructed based on the results of your blood work.  Thank you for choosing [facility]!!

## 2018-03-26 ENCOUNTER — Other Ambulatory Visit (HOSPITAL_COMMUNITY)
Admission: RE | Admit: 2018-03-26 | Discharge: 2018-03-26 | Disposition: A | Discharge status: Home / Self Care | Payer: Medicare Other | Source: Ambulatory Visit | Attending: Cardiology | Admitting: Cardiology

## 2018-03-26 DIAGNOSIS — I482 Chronic atrial fibrillation: Secondary | ICD-10-CM | POA: Diagnosis not present

## 2018-03-26 DIAGNOSIS — I10 Essential (primary) hypertension: Secondary | ICD-10-CM | POA: Diagnosis not present

## 2018-03-26 DIAGNOSIS — R627 Adult failure to thrive: Secondary | ICD-10-CM | POA: Diagnosis not present

## 2018-03-26 DIAGNOSIS — Z79899 Other long term (current) drug therapy: Secondary | ICD-10-CM | POA: Diagnosis not present

## 2018-03-26 LAB — CBC
HEMATOCRIT: 41.4 % (ref 39.0–52.0)
HEMOGLOBIN: 13.6 g/dL (ref 13.0–17.0)
MCH: 30.4 pg (ref 26.0–34.0)
MCHC: 32.9 g/dL (ref 30.0–36.0)
MCV: 92.4 fL (ref 78.0–100.0)
Platelets: 260 10*3/uL (ref 150–400)
RBC: 4.48 MIL/uL (ref 4.22–5.81)
RDW: 12.9 % (ref 11.5–15.5)
WBC: 8.5 10*3/uL (ref 4.0–10.5)

## 2018-03-26 LAB — COMPREHENSIVE METABOLIC PANEL
ALT: 16 U/L — ABNORMAL LOW (ref 17–63)
AST: 28 U/L (ref 15–41)
Albumin: 3.2 g/dL — ABNORMAL LOW (ref 3.5–5.0)
Alkaline Phosphatase: 89 U/L (ref 38–126)
Anion gap: 13 (ref 5–15)
BILIRUBIN TOTAL: 0.7 mg/dL (ref 0.3–1.2)
BUN: 29 mg/dL — AB (ref 6–20)
CHLORIDE: 94 mmol/L — AB (ref 101–111)
CO2: 27 mmol/L (ref 22–32)
Calcium: 9.1 mg/dL (ref 8.9–10.3)
Creatinine, Ser: 1.56 mg/dL — ABNORMAL HIGH (ref 0.61–1.24)
GFR, EST AFRICAN AMERICAN: 45 mL/min — AB (ref >60)
GFR, EST NON AFRICAN AMERICAN: 39 mL/min — AB (ref >60)
Glucose, Bld: 195 mg/dL — ABNORMAL HIGH (ref 65–99)
POTASSIUM: 3.8 mmol/L (ref 3.5–5.1)
Sodium: 134 mmol/L — ABNORMAL LOW (ref 135–145)
TOTAL PROTEIN: 6.9 g/dL (ref 6.5–8.1)

## 2018-03-26 LAB — URINALYSIS, ROUTINE W REFLEX MICROSCOPIC
Bilirubin Urine: NEGATIVE
Glucose, UA: NEGATIVE mg/dL
HGB URINE DIPSTICK: NEGATIVE
Ketones, ur: NEGATIVE mg/dL
LEUKOCYTES UA: NEGATIVE
NITRITE: NEGATIVE
PROTEIN: NEGATIVE mg/dL
SPECIFIC GRAVITY, URINE: 1.015 (ref 1.005–1.030)
pH: 5 (ref 5.0–8.0)

## 2018-03-26 LAB — TSH: TSH: 2.017 u[IU]/mL (ref 0.350–4.500)

## 2018-03-27 DIAGNOSIS — I482 Chronic atrial fibrillation: Secondary | ICD-10-CM | POA: Diagnosis not present

## 2018-03-30 DIAGNOSIS — J449 Chronic obstructive pulmonary disease, unspecified: Secondary | ICD-10-CM | POA: Diagnosis not present

## 2018-03-30 DIAGNOSIS — M48062 Spinal stenosis, lumbar region with neurogenic claudication: Secondary | ICD-10-CM | POA: Diagnosis not present

## 2018-03-30 DIAGNOSIS — Z4789 Encounter for other orthopedic aftercare: Secondary | ICD-10-CM | POA: Diagnosis not present

## 2018-03-30 DIAGNOSIS — E119 Type 2 diabetes mellitus without complications: Secondary | ICD-10-CM | POA: Diagnosis not present

## 2018-03-30 DIAGNOSIS — M810 Age-related osteoporosis without current pathological fracture: Secondary | ICD-10-CM | POA: Diagnosis not present

## 2018-03-30 DIAGNOSIS — I482 Chronic atrial fibrillation: Secondary | ICD-10-CM | POA: Diagnosis not present

## 2018-04-02 DIAGNOSIS — M48062 Spinal stenosis, lumbar region with neurogenic claudication: Secondary | ICD-10-CM | POA: Diagnosis not present

## 2018-04-02 DIAGNOSIS — I482 Chronic atrial fibrillation: Secondary | ICD-10-CM | POA: Diagnosis not present

## 2018-04-02 DIAGNOSIS — J449 Chronic obstructive pulmonary disease, unspecified: Secondary | ICD-10-CM | POA: Diagnosis not present

## 2018-04-02 DIAGNOSIS — Z4789 Encounter for other orthopedic aftercare: Secondary | ICD-10-CM | POA: Diagnosis not present

## 2018-04-02 DIAGNOSIS — M810 Age-related osteoporosis without current pathological fracture: Secondary | ICD-10-CM | POA: Diagnosis not present

## 2018-04-02 DIAGNOSIS — E119 Type 2 diabetes mellitus without complications: Secondary | ICD-10-CM | POA: Diagnosis not present

## 2018-04-06 DIAGNOSIS — J449 Chronic obstructive pulmonary disease, unspecified: Secondary | ICD-10-CM | POA: Diagnosis not present

## 2018-04-06 DIAGNOSIS — I482 Chronic atrial fibrillation: Secondary | ICD-10-CM | POA: Diagnosis not present

## 2018-04-06 DIAGNOSIS — M48062 Spinal stenosis, lumbar region with neurogenic claudication: Secondary | ICD-10-CM | POA: Diagnosis not present

## 2018-04-06 DIAGNOSIS — M810 Age-related osteoporosis without current pathological fracture: Secondary | ICD-10-CM | POA: Diagnosis not present

## 2018-04-06 DIAGNOSIS — Z4789 Encounter for other orthopedic aftercare: Secondary | ICD-10-CM | POA: Diagnosis not present

## 2018-04-06 DIAGNOSIS — E119 Type 2 diabetes mellitus without complications: Secondary | ICD-10-CM | POA: Diagnosis not present

## 2018-04-09 DIAGNOSIS — M48062 Spinal stenosis, lumbar region with neurogenic claudication: Secondary | ICD-10-CM | POA: Diagnosis not present

## 2018-04-09 DIAGNOSIS — J449 Chronic obstructive pulmonary disease, unspecified: Secondary | ICD-10-CM | POA: Diagnosis not present

## 2018-04-09 DIAGNOSIS — I482 Chronic atrial fibrillation: Secondary | ICD-10-CM | POA: Diagnosis not present

## 2018-04-09 DIAGNOSIS — M810 Age-related osteoporosis without current pathological fracture: Secondary | ICD-10-CM | POA: Diagnosis not present

## 2018-04-09 DIAGNOSIS — Z4789 Encounter for other orthopedic aftercare: Secondary | ICD-10-CM | POA: Diagnosis not present

## 2018-04-09 DIAGNOSIS — E119 Type 2 diabetes mellitus without complications: Secondary | ICD-10-CM | POA: Diagnosis not present

## 2018-04-13 DIAGNOSIS — J449 Chronic obstructive pulmonary disease, unspecified: Secondary | ICD-10-CM | POA: Diagnosis not present

## 2018-04-13 DIAGNOSIS — M810 Age-related osteoporosis without current pathological fracture: Secondary | ICD-10-CM | POA: Diagnosis not present

## 2018-04-13 DIAGNOSIS — E119 Type 2 diabetes mellitus without complications: Secondary | ICD-10-CM | POA: Diagnosis not present

## 2018-04-13 DIAGNOSIS — Z4789 Encounter for other orthopedic aftercare: Secondary | ICD-10-CM | POA: Diagnosis not present

## 2018-04-13 DIAGNOSIS — I482 Chronic atrial fibrillation: Secondary | ICD-10-CM | POA: Diagnosis not present

## 2018-04-13 DIAGNOSIS — M48062 Spinal stenosis, lumbar region with neurogenic claudication: Secondary | ICD-10-CM | POA: Diagnosis not present

## 2018-04-16 DIAGNOSIS — M48062 Spinal stenosis, lumbar region with neurogenic claudication: Secondary | ICD-10-CM | POA: Diagnosis not present

## 2018-04-16 DIAGNOSIS — M810 Age-related osteoporosis without current pathological fracture: Secondary | ICD-10-CM | POA: Diagnosis not present

## 2018-04-16 DIAGNOSIS — Z4789 Encounter for other orthopedic aftercare: Secondary | ICD-10-CM | POA: Diagnosis not present

## 2018-04-16 DIAGNOSIS — Z0000 Encounter for general adult medical examination without abnormal findings: Secondary | ICD-10-CM | POA: Diagnosis not present

## 2018-04-16 DIAGNOSIS — I482 Chronic atrial fibrillation: Secondary | ICD-10-CM | POA: Diagnosis not present

## 2018-04-16 DIAGNOSIS — Z1389 Encounter for screening for other disorder: Secondary | ICD-10-CM | POA: Diagnosis not present

## 2018-04-16 DIAGNOSIS — E119 Type 2 diabetes mellitus without complications: Secondary | ICD-10-CM | POA: Diagnosis not present

## 2018-04-16 DIAGNOSIS — J449 Chronic obstructive pulmonary disease, unspecified: Secondary | ICD-10-CM | POA: Diagnosis not present

## 2018-04-17 DIAGNOSIS — Z7901 Long term (current) use of anticoagulants: Secondary | ICD-10-CM | POA: Diagnosis not present

## 2018-04-28 ENCOUNTER — Encounter: Payer: Self-pay | Admitting: Cardiology

## 2018-05-08 DIAGNOSIS — M48062 Spinal stenosis, lumbar region with neurogenic claudication: Secondary | ICD-10-CM | POA: Diagnosis not present

## 2018-05-08 DIAGNOSIS — M47816 Spondylosis without myelopathy or radiculopathy, lumbar region: Secondary | ICD-10-CM | POA: Diagnosis not present

## 2018-05-17 ENCOUNTER — Other Ambulatory Visit: Payer: Self-pay | Admitting: Cardiology

## 2018-05-18 ENCOUNTER — Encounter: Payer: Self-pay | Admitting: Cardiology

## 2018-05-27 DIAGNOSIS — I482 Chronic atrial fibrillation: Secondary | ICD-10-CM | POA: Diagnosis not present

## 2018-05-27 DIAGNOSIS — J441 Chronic obstructive pulmonary disease with (acute) exacerbation: Secondary | ICD-10-CM | POA: Diagnosis not present

## 2018-05-27 DIAGNOSIS — E1165 Type 2 diabetes mellitus with hyperglycemia: Secondary | ICD-10-CM | POA: Diagnosis not present

## 2018-05-28 DIAGNOSIS — M47816 Spondylosis without myelopathy or radiculopathy, lumbar region: Secondary | ICD-10-CM | POA: Diagnosis not present

## 2018-05-28 DIAGNOSIS — M48062 Spinal stenosis, lumbar region with neurogenic claudication: Secondary | ICD-10-CM | POA: Diagnosis not present

## 2018-06-04 DIAGNOSIS — L570 Actinic keratosis: Secondary | ICD-10-CM | POA: Diagnosis not present

## 2018-06-04 DIAGNOSIS — L720 Epidermal cyst: Secondary | ICD-10-CM | POA: Diagnosis not present

## 2018-06-04 DIAGNOSIS — X32XXXD Exposure to sunlight, subsequent encounter: Secondary | ICD-10-CM | POA: Diagnosis not present

## 2018-06-05 DIAGNOSIS — Z1389 Encounter for screening for other disorder: Secondary | ICD-10-CM | POA: Diagnosis not present

## 2018-06-05 DIAGNOSIS — Z7901 Long term (current) use of anticoagulants: Secondary | ICD-10-CM | POA: Diagnosis not present

## 2018-06-05 DIAGNOSIS — Z0000 Encounter for general adult medical examination without abnormal findings: Secondary | ICD-10-CM | POA: Diagnosis not present

## 2018-06-05 DIAGNOSIS — G9009 Other idiopathic peripheral autonomic neuropathy: Secondary | ICD-10-CM | POA: Diagnosis not present

## 2018-06-05 DIAGNOSIS — Z8781 Personal history of (healed) traumatic fracture: Secondary | ICD-10-CM | POA: Diagnosis not present

## 2018-06-11 DIAGNOSIS — M79676 Pain in unspecified toe(s): Secondary | ICD-10-CM | POA: Diagnosis not present

## 2018-06-11 DIAGNOSIS — E1142 Type 2 diabetes mellitus with diabetic polyneuropathy: Secondary | ICD-10-CM | POA: Diagnosis not present

## 2018-06-11 DIAGNOSIS — L84 Corns and callosities: Secondary | ICD-10-CM | POA: Diagnosis not present

## 2018-06-11 DIAGNOSIS — B351 Tinea unguium: Secondary | ICD-10-CM | POA: Diagnosis not present

## 2018-06-12 DIAGNOSIS — Z7901 Long term (current) use of anticoagulants: Secondary | ICD-10-CM | POA: Diagnosis not present

## 2018-06-12 DIAGNOSIS — Z0000 Encounter for general adult medical examination without abnormal findings: Secondary | ICD-10-CM | POA: Diagnosis not present

## 2018-06-12 DIAGNOSIS — G9009 Other idiopathic peripheral autonomic neuropathy: Secondary | ICD-10-CM | POA: Diagnosis not present

## 2018-06-12 DIAGNOSIS — Z1389 Encounter for screening for other disorder: Secondary | ICD-10-CM | POA: Diagnosis not present

## 2018-06-17 DIAGNOSIS — M6281 Muscle weakness (generalized): Secondary | ICD-10-CM | POA: Diagnosis not present

## 2018-06-17 DIAGNOSIS — M546 Pain in thoracic spine: Secondary | ICD-10-CM | POA: Diagnosis not present

## 2018-06-17 DIAGNOSIS — R262 Difficulty in walking, not elsewhere classified: Secondary | ICD-10-CM | POA: Diagnosis not present

## 2018-06-17 DIAGNOSIS — M545 Low back pain: Secondary | ICD-10-CM | POA: Diagnosis not present

## 2018-06-22 DIAGNOSIS — E1165 Type 2 diabetes mellitus with hyperglycemia: Secondary | ICD-10-CM | POA: Diagnosis not present

## 2018-06-22 DIAGNOSIS — I482 Chronic atrial fibrillation: Secondary | ICD-10-CM | POA: Diagnosis not present

## 2018-06-22 DIAGNOSIS — J441 Chronic obstructive pulmonary disease with (acute) exacerbation: Secondary | ICD-10-CM | POA: Diagnosis not present

## 2018-06-26 DIAGNOSIS — Z7901 Long term (current) use of anticoagulants: Secondary | ICD-10-CM | POA: Diagnosis not present

## 2018-07-14 DIAGNOSIS — M48062 Spinal stenosis, lumbar region with neurogenic claudication: Secondary | ICD-10-CM | POA: Diagnosis not present

## 2018-07-14 DIAGNOSIS — S32020D Wedge compression fracture of second lumbar vertebra, subsequent encounter for fracture with routine healing: Secondary | ICD-10-CM | POA: Diagnosis not present

## 2018-07-14 DIAGNOSIS — M47816 Spondylosis without myelopathy or radiculopathy, lumbar region: Secondary | ICD-10-CM | POA: Diagnosis not present

## 2018-07-17 DIAGNOSIS — R262 Difficulty in walking, not elsewhere classified: Secondary | ICD-10-CM | POA: Diagnosis not present

## 2018-07-17 DIAGNOSIS — M47816 Spondylosis without myelopathy or radiculopathy, lumbar region: Secondary | ICD-10-CM | POA: Diagnosis not present

## 2018-07-17 DIAGNOSIS — M6281 Muscle weakness (generalized): Secondary | ICD-10-CM | POA: Diagnosis not present

## 2018-07-24 DIAGNOSIS — R262 Difficulty in walking, not elsewhere classified: Secondary | ICD-10-CM | POA: Diagnosis not present

## 2018-07-24 DIAGNOSIS — M6281 Muscle weakness (generalized): Secondary | ICD-10-CM | POA: Diagnosis not present

## 2018-07-24 DIAGNOSIS — M47816 Spondylosis without myelopathy or radiculopathy, lumbar region: Secondary | ICD-10-CM | POA: Diagnosis not present

## 2018-07-27 DIAGNOSIS — I482 Chronic atrial fibrillation: Secondary | ICD-10-CM | POA: Diagnosis not present

## 2018-07-27 DIAGNOSIS — I10 Essential (primary) hypertension: Secondary | ICD-10-CM | POA: Diagnosis not present

## 2018-07-27 DIAGNOSIS — Z0000 Encounter for general adult medical examination without abnormal findings: Secondary | ICD-10-CM | POA: Diagnosis not present

## 2018-07-27 DIAGNOSIS — Z1389 Encounter for screening for other disorder: Secondary | ICD-10-CM | POA: Diagnosis not present

## 2018-07-27 DIAGNOSIS — G9009 Other idiopathic peripheral autonomic neuropathy: Secondary | ICD-10-CM | POA: Diagnosis not present

## 2018-07-27 DIAGNOSIS — E11610 Type 2 diabetes mellitus with diabetic neuropathic arthropathy: Secondary | ICD-10-CM | POA: Diagnosis not present

## 2018-07-28 DIAGNOSIS — M6281 Muscle weakness (generalized): Secondary | ICD-10-CM | POA: Diagnosis not present

## 2018-07-28 DIAGNOSIS — M47816 Spondylosis without myelopathy or radiculopathy, lumbar region: Secondary | ICD-10-CM | POA: Diagnosis not present

## 2018-07-28 DIAGNOSIS — R262 Difficulty in walking, not elsewhere classified: Secondary | ICD-10-CM | POA: Diagnosis not present

## 2018-07-30 DIAGNOSIS — I10 Essential (primary) hypertension: Secondary | ICD-10-CM | POA: Diagnosis not present

## 2018-07-30 DIAGNOSIS — E11610 Type 2 diabetes mellitus with diabetic neuropathic arthropathy: Secondary | ICD-10-CM | POA: Diagnosis not present

## 2018-08-03 DIAGNOSIS — M6281 Muscle weakness (generalized): Secondary | ICD-10-CM | POA: Diagnosis not present

## 2018-08-03 DIAGNOSIS — R262 Difficulty in walking, not elsewhere classified: Secondary | ICD-10-CM | POA: Diagnosis not present

## 2018-08-03 DIAGNOSIS — M47816 Spondylosis without myelopathy or radiculopathy, lumbar region: Secondary | ICD-10-CM | POA: Diagnosis not present

## 2018-08-07 DIAGNOSIS — M47816 Spondylosis without myelopathy or radiculopathy, lumbar region: Secondary | ICD-10-CM | POA: Diagnosis not present

## 2018-08-07 DIAGNOSIS — R262 Difficulty in walking, not elsewhere classified: Secondary | ICD-10-CM | POA: Diagnosis not present

## 2018-08-07 DIAGNOSIS — M6281 Muscle weakness (generalized): Secondary | ICD-10-CM | POA: Diagnosis not present

## 2018-08-12 DIAGNOSIS — M6281 Muscle weakness (generalized): Secondary | ICD-10-CM | POA: Diagnosis not present

## 2018-08-12 DIAGNOSIS — R262 Difficulty in walking, not elsewhere classified: Secondary | ICD-10-CM | POA: Diagnosis not present

## 2018-08-12 DIAGNOSIS — M47816 Spondylosis without myelopathy or radiculopathy, lumbar region: Secondary | ICD-10-CM | POA: Diagnosis not present

## 2018-08-14 DIAGNOSIS — M47816 Spondylosis without myelopathy or radiculopathy, lumbar region: Secondary | ICD-10-CM | POA: Diagnosis not present

## 2018-08-14 DIAGNOSIS — R262 Difficulty in walking, not elsewhere classified: Secondary | ICD-10-CM | POA: Diagnosis not present

## 2018-08-14 DIAGNOSIS — M6281 Muscle weakness (generalized): Secondary | ICD-10-CM | POA: Diagnosis not present

## 2018-08-20 DIAGNOSIS — E1142 Type 2 diabetes mellitus with diabetic polyneuropathy: Secondary | ICD-10-CM | POA: Diagnosis not present

## 2018-08-20 DIAGNOSIS — M79676 Pain in unspecified toe(s): Secondary | ICD-10-CM | POA: Diagnosis not present

## 2018-08-20 DIAGNOSIS — B351 Tinea unguium: Secondary | ICD-10-CM | POA: Diagnosis not present

## 2018-08-20 DIAGNOSIS — L84 Corns and callosities: Secondary | ICD-10-CM | POA: Diagnosis not present

## 2018-08-24 DIAGNOSIS — M6281 Muscle weakness (generalized): Secondary | ICD-10-CM | POA: Diagnosis not present

## 2018-08-24 DIAGNOSIS — M47816 Spondylosis without myelopathy or radiculopathy, lumbar region: Secondary | ICD-10-CM | POA: Diagnosis not present

## 2018-08-24 DIAGNOSIS — R262 Difficulty in walking, not elsewhere classified: Secondary | ICD-10-CM | POA: Diagnosis not present

## 2018-08-25 DIAGNOSIS — M47816 Spondylosis without myelopathy or radiculopathy, lumbar region: Secondary | ICD-10-CM | POA: Diagnosis not present

## 2018-08-25 DIAGNOSIS — S32020D Wedge compression fracture of second lumbar vertebra, subsequent encounter for fracture with routine healing: Secondary | ICD-10-CM | POA: Diagnosis not present

## 2018-08-26 DIAGNOSIS — E11610 Type 2 diabetes mellitus with diabetic neuropathic arthropathy: Secondary | ICD-10-CM | POA: Diagnosis not present

## 2018-08-26 DIAGNOSIS — I10 Essential (primary) hypertension: Secondary | ICD-10-CM | POA: Diagnosis not present

## 2018-09-11 DIAGNOSIS — I482 Chronic atrial fibrillation, unspecified: Secondary | ICD-10-CM | POA: Diagnosis not present

## 2018-10-14 DIAGNOSIS — E11610 Type 2 diabetes mellitus with diabetic neuropathic arthropathy: Secondary | ICD-10-CM | POA: Diagnosis not present

## 2018-10-14 DIAGNOSIS — I10 Essential (primary) hypertension: Secondary | ICD-10-CM | POA: Diagnosis not present

## 2018-10-26 DIAGNOSIS — E11610 Type 2 diabetes mellitus with diabetic neuropathic arthropathy: Secondary | ICD-10-CM | POA: Diagnosis not present

## 2018-10-26 DIAGNOSIS — G9009 Other idiopathic peripheral autonomic neuropathy: Secondary | ICD-10-CM | POA: Diagnosis not present

## 2018-10-26 DIAGNOSIS — J3089 Other allergic rhinitis: Secondary | ICD-10-CM | POA: Diagnosis not present

## 2018-10-26 DIAGNOSIS — I10 Essential (primary) hypertension: Secondary | ICD-10-CM | POA: Diagnosis not present

## 2018-10-26 DIAGNOSIS — I482 Chronic atrial fibrillation, unspecified: Secondary | ICD-10-CM | POA: Diagnosis not present

## 2018-10-29 DIAGNOSIS — M79676 Pain in unspecified toe(s): Secondary | ICD-10-CM | POA: Diagnosis not present

## 2018-10-29 DIAGNOSIS — L84 Corns and callosities: Secondary | ICD-10-CM | POA: Diagnosis not present

## 2018-10-29 DIAGNOSIS — B351 Tinea unguium: Secondary | ICD-10-CM | POA: Diagnosis not present

## 2018-10-29 DIAGNOSIS — E1142 Type 2 diabetes mellitus with diabetic polyneuropathy: Secondary | ICD-10-CM | POA: Diagnosis not present

## 2018-11-13 DIAGNOSIS — J3089 Other allergic rhinitis: Secondary | ICD-10-CM | POA: Diagnosis not present

## 2018-11-13 DIAGNOSIS — Z7901 Long term (current) use of anticoagulants: Secondary | ICD-10-CM | POA: Diagnosis not present

## 2018-11-13 DIAGNOSIS — I10 Essential (primary) hypertension: Secondary | ICD-10-CM | POA: Diagnosis not present

## 2018-11-13 DIAGNOSIS — G9009 Other idiopathic peripheral autonomic neuropathy: Secondary | ICD-10-CM | POA: Diagnosis not present

## 2018-11-13 DIAGNOSIS — E11610 Type 2 diabetes mellitus with diabetic neuropathic arthropathy: Secondary | ICD-10-CM | POA: Diagnosis not present

## 2018-11-13 DIAGNOSIS — I482 Chronic atrial fibrillation, unspecified: Secondary | ICD-10-CM | POA: Diagnosis not present

## 2018-11-17 DIAGNOSIS — M47816 Spondylosis without myelopathy or radiculopathy, lumbar region: Secondary | ICD-10-CM | POA: Diagnosis not present

## 2018-11-17 DIAGNOSIS — S32020D Wedge compression fracture of second lumbar vertebra, subsequent encounter for fracture with routine healing: Secondary | ICD-10-CM | POA: Diagnosis not present

## 2018-11-26 DIAGNOSIS — I10 Essential (primary) hypertension: Secondary | ICD-10-CM | POA: Diagnosis not present

## 2018-11-26 DIAGNOSIS — E11610 Type 2 diabetes mellitus with diabetic neuropathic arthropathy: Secondary | ICD-10-CM | POA: Diagnosis not present

## 2018-11-26 DIAGNOSIS — I482 Chronic atrial fibrillation, unspecified: Secondary | ICD-10-CM | POA: Diagnosis not present

## 2018-12-15 NOTE — Progress Notes (Signed)
HPI The patient presents for evaluation of atrial fibrillation. He has had a history of SVT ablated twice around 2010 when he lived in the [place]. He had atrial fibrillation following this and had cardioversion twice.  He is now in permanent atrial fib.  Since I last saw him he has done OK from a cardiac standpoint.  He thinks he might feel weak some days and his heart rate might be faster but there is not been a clear correlation.  He is likely in permanent atrial fibrillation.  He is always had reasonable rate control when his wife, who is a Marine scientist, takes his heart rate.  He has not had any presyncope or syncope.  He gets around very slowly because of his back problems.  He has difficulty with his gait.    Allergies  Allergen Reactions  . Betadine [Povidone Iodine] Rash    Current Outpatient Medications  Medication Sig Dispense Refill  . albuterol (PROVENTIL) (2.5 MG/3ML) 0.083% nebulizer solution Inhale 2.5 mg into the lungs every 2 (two) hours as needed.     Marland Kitchen BIOTIN PO Take 1 capsule by mouth daily.    Marland Kitchen CRANBERRY EXTRACT PO Take 1 capsule by mouth daily.    Marland Kitchen diltiazem (CARDIZEM CD) 240 MG 24 hr capsule TAKE ONE (1) CAPSULE EACH DAY 90 capsule 3  . furosemide (LASIX) 40 MG tablet Take 40 mg by mouth daily.    . metFORMIN (GLUCOPHAGE) 1000 MG tablet Take 1,000 mg by mouth 2 (two) times daily with a meal.     . Multiple Vitamin (MULTIVITAMIN) tablet Take 1 tablet by mouth daily.    . Omega-3 Fatty Acids (FISH OIL) 1000 MG CAPS Take 1 capsule by mouth daily.    Marland Kitchen warfarin (COUMADIN) 5 MG tablet Take 5 mg by mouth daily.     No current facility-administered medications for this visit.     Past Medical History:  Diagnosis Date  . Atrial fibrillation ([place])   . Cardiac dysrhythmia, unspecified   . Compression fracture of L4 lumbar vertebra   . Compression fracture of T12 vertebra (HCC)   . Diabetes mellitus without complication ([place])   . Other and unspecified hyperlipidemia   .  Sleep apnea   . SVT (supraventricular tachycardia) ([place])   . Unspecified essential hypertension   . Vitamin D deficiency     Past Surgical History:  Procedure Laterality Date  . ABLATION     x2  . CARDIOVERSION     x2  . HIATAL HERNIA REPAIR     x2  . KYPHOPLASTY    . SKIN CANCER EXCISION      ROS:  Back pain.  Otherwise as stated in the HPI and negative for all other systems.  PHYSICAL EXAM BP 130/84   Pulse 94   Ht 5' 8.5" (1.74 m)   Wt 191 lb (86.6 kg)   BMI 28.62 kg/m   GENERAL:  Well appearing NECK:  No jugular venous distention, waveform within normal limits, carotid upstroke brisk and symmetric, no bruits, no thyromegaly LUNGS:  Clear to auscultation bilaterally CHEST:  Unremarkable HEART:  PMI not displaced or sustained,S1 and S2 within normal limits, no S3, no clicks, no rubs, no murmurs, irregular ABD:  Flat, positive bowel sounds normal in frequency in pitch, no bruits, no rebound, no guarding, no midline pulsatile mass, no hepatomegaly, no splenomegaly EXT:  2 plus pulses throughout, no edema, no cyanosis no clubbing    EKG:  Atrial fibrillation, rate 94,  axis left axis deviation, left anterior fascicular block, anterior R wave progression, no acute ST-T wave changes.   ASSESSMENT AND PLAN  Atrial fibrillation -     Jeremy Vargas has a CHA2DS2 - VASc score of 4 with a risk of stroke of 4% .  He tolerates anticoagulation.  He has reasonable rate control.  No change in therapy.  SVT -he has had ablation of this and I do not think that he is ever had documented recurrence.   HTN -  The blood pressure is controlled.  He will continue the meds as listed.

## 2018-12-16 ENCOUNTER — Ambulatory Visit (INDEPENDENT_AMBULATORY_CARE_PROVIDER_SITE_OTHER): Payer: Medicare Other | Admitting: Cardiology

## 2018-12-16 ENCOUNTER — Encounter: Payer: Self-pay | Admitting: Cardiology

## 2018-12-16 VITALS — BP 130/84 | HR 94 | Ht 68.5 in | Wt 191.0 lb

## 2018-12-16 DIAGNOSIS — I4821 Permanent atrial fibrillation: Secondary | ICD-10-CM | POA: Diagnosis not present

## 2018-12-16 DIAGNOSIS — I10 Essential (primary) hypertension: Secondary | ICD-10-CM

## 2018-12-16 NOTE — Patient Instructions (Signed)
Medication Instructions:  The current medical regimen is effective;  continue present plan and medications.  If you need a refill on your cardiac medications before your next appointment, please call your pharmacy.   Follow-Up: Follow up in 1 year with Dr. Hochrein.  You will receive a letter in the mail 2 months before you are due.  Please call us when you receive this letter to schedule your follow up appointment.  Thank you for choosing [facility]!!     

## 2018-12-21 DIAGNOSIS — L570 Actinic keratosis: Secondary | ICD-10-CM | POA: Diagnosis not present

## 2018-12-21 DIAGNOSIS — L820 Inflamed seborrheic keratosis: Secondary | ICD-10-CM | POA: Diagnosis not present

## 2018-12-21 DIAGNOSIS — X32XXXD Exposure to sunlight, subsequent encounter: Secondary | ICD-10-CM | POA: Diagnosis not present

## 2018-12-22 DIAGNOSIS — I482 Chronic atrial fibrillation, unspecified: Secondary | ICD-10-CM | POA: Diagnosis not present

## 2018-12-22 DIAGNOSIS — E11610 Type 2 diabetes mellitus with diabetic neuropathic arthropathy: Secondary | ICD-10-CM | POA: Diagnosis not present

## 2018-12-22 DIAGNOSIS — I10 Essential (primary) hypertension: Secondary | ICD-10-CM | POA: Diagnosis not present

## 2018-12-25 DIAGNOSIS — G9009 Other idiopathic peripheral autonomic neuropathy: Secondary | ICD-10-CM | POA: Diagnosis not present

## 2018-12-25 DIAGNOSIS — I10 Essential (primary) hypertension: Secondary | ICD-10-CM | POA: Diagnosis not present

## 2018-12-25 DIAGNOSIS — I482 Chronic atrial fibrillation, unspecified: Secondary | ICD-10-CM | POA: Diagnosis not present

## 2018-12-25 DIAGNOSIS — J3089 Other allergic rhinitis: Secondary | ICD-10-CM | POA: Diagnosis not present

## 2018-12-25 DIAGNOSIS — E11610 Type 2 diabetes mellitus with diabetic neuropathic arthropathy: Secondary | ICD-10-CM | POA: Diagnosis not present

## 2019-01-07 DIAGNOSIS — L84 Corns and callosities: Secondary | ICD-10-CM | POA: Diagnosis not present

## 2019-01-07 DIAGNOSIS — E1142 Type 2 diabetes mellitus with diabetic polyneuropathy: Secondary | ICD-10-CM | POA: Diagnosis not present

## 2019-01-07 DIAGNOSIS — B351 Tinea unguium: Secondary | ICD-10-CM | POA: Diagnosis not present

## 2019-01-07 DIAGNOSIS — M79676 Pain in unspecified toe(s): Secondary | ICD-10-CM | POA: Diagnosis not present

## 2019-01-15 DIAGNOSIS — I482 Chronic atrial fibrillation, unspecified: Secondary | ICD-10-CM | POA: Diagnosis not present

## 2019-02-12 DIAGNOSIS — I482 Chronic atrial fibrillation, unspecified: Secondary | ICD-10-CM | POA: Diagnosis not present

## 2019-02-15 DIAGNOSIS — I482 Chronic atrial fibrillation, unspecified: Secondary | ICD-10-CM | POA: Diagnosis not present

## 2019-02-15 DIAGNOSIS — I10 Essential (primary) hypertension: Secondary | ICD-10-CM | POA: Diagnosis not present

## 2019-02-15 DIAGNOSIS — E11610 Type 2 diabetes mellitus with diabetic neuropathic arthropathy: Secondary | ICD-10-CM | POA: Diagnosis not present

## 2019-02-25 DIAGNOSIS — I482 Chronic atrial fibrillation, unspecified: Secondary | ICD-10-CM | POA: Diagnosis not present

## 2019-02-25 DIAGNOSIS — I10 Essential (primary) hypertension: Secondary | ICD-10-CM | POA: Diagnosis not present

## 2019-02-25 DIAGNOSIS — E11610 Type 2 diabetes mellitus with diabetic neuropathic arthropathy: Secondary | ICD-10-CM | POA: Diagnosis not present

## 2019-03-16 DIAGNOSIS — E1142 Type 2 diabetes mellitus with diabetic polyneuropathy: Secondary | ICD-10-CM | POA: Diagnosis not present

## 2019-03-16 DIAGNOSIS — M79676 Pain in unspecified toe(s): Secondary | ICD-10-CM | POA: Diagnosis not present

## 2019-03-16 DIAGNOSIS — L84 Corns and callosities: Secondary | ICD-10-CM | POA: Diagnosis not present

## 2019-03-16 DIAGNOSIS — B351 Tinea unguium: Secondary | ICD-10-CM | POA: Diagnosis not present

## 2019-03-19 DIAGNOSIS — I482 Chronic atrial fibrillation, unspecified: Secondary | ICD-10-CM | POA: Diagnosis not present

## 2019-03-19 DIAGNOSIS — E11610 Type 2 diabetes mellitus with diabetic neuropathic arthropathy: Secondary | ICD-10-CM | POA: Diagnosis not present

## 2019-03-29 DIAGNOSIS — E11610 Type 2 diabetes mellitus with diabetic neuropathic arthropathy: Secondary | ICD-10-CM | POA: Diagnosis not present

## 2019-03-29 DIAGNOSIS — I482 Chronic atrial fibrillation, unspecified: Secondary | ICD-10-CM | POA: Diagnosis not present

## 2019-03-29 DIAGNOSIS — I10 Essential (primary) hypertension: Secondary | ICD-10-CM | POA: Diagnosis not present

## 2019-04-23 DIAGNOSIS — Z7901 Long term (current) use of anticoagulants: Secondary | ICD-10-CM | POA: Diagnosis not present

## 2019-04-29 DIAGNOSIS — E1142 Type 2 diabetes mellitus with diabetic polyneuropathy: Secondary | ICD-10-CM | POA: Diagnosis not present

## 2019-04-29 DIAGNOSIS — M79676 Pain in unspecified toe(s): Secondary | ICD-10-CM | POA: Diagnosis not present

## 2019-04-29 DIAGNOSIS — B351 Tinea unguium: Secondary | ICD-10-CM | POA: Diagnosis not present

## 2019-04-29 DIAGNOSIS — L84 Corns and callosities: Secondary | ICD-10-CM | POA: Diagnosis not present

## 2019-05-16 ENCOUNTER — Other Ambulatory Visit: Payer: Self-pay | Admitting: Cardiology

## 2019-05-17 DIAGNOSIS — G9009 Other idiopathic peripheral autonomic neuropathy: Secondary | ICD-10-CM | POA: Diagnosis not present

## 2019-05-17 DIAGNOSIS — I10 Essential (primary) hypertension: Secondary | ICD-10-CM | POA: Diagnosis not present

## 2019-05-17 DIAGNOSIS — E11610 Type 2 diabetes mellitus with diabetic neuropathic arthropathy: Secondary | ICD-10-CM | POA: Diagnosis not present

## 2019-05-17 DIAGNOSIS — J3089 Other allergic rhinitis: Secondary | ICD-10-CM | POA: Diagnosis not present

## 2019-05-19 DIAGNOSIS — M810 Age-related osteoporosis without current pathological fracture: Secondary | ICD-10-CM | POA: Diagnosis not present

## 2019-05-25 DIAGNOSIS — R918 Other nonspecific abnormal finding of lung field: Secondary | ICD-10-CM | POA: Diagnosis not present

## 2019-05-25 DIAGNOSIS — R0602 Shortness of breath: Secondary | ICD-10-CM | POA: Diagnosis not present

## 2019-05-31 DIAGNOSIS — M810 Age-related osteoporosis without current pathological fracture: Secondary | ICD-10-CM | POA: Diagnosis not present

## 2019-06-01 DIAGNOSIS — G9009 Other idiopathic peripheral autonomic neuropathy: Secondary | ICD-10-CM | POA: Diagnosis not present

## 2019-06-01 DIAGNOSIS — I10 Essential (primary) hypertension: Secondary | ICD-10-CM | POA: Diagnosis not present

## 2019-06-01 DIAGNOSIS — E11610 Type 2 diabetes mellitus with diabetic neuropathic arthropathy: Secondary | ICD-10-CM | POA: Diagnosis not present

## 2019-06-17 ENCOUNTER — Other Ambulatory Visit: Payer: Self-pay

## 2019-06-18 DIAGNOSIS — I482 Chronic atrial fibrillation, unspecified: Secondary | ICD-10-CM | POA: Diagnosis not present

## 2019-07-15 DIAGNOSIS — L84 Corns and callosities: Secondary | ICD-10-CM | POA: Diagnosis not present

## 2019-07-15 DIAGNOSIS — E1142 Type 2 diabetes mellitus with diabetic polyneuropathy: Secondary | ICD-10-CM | POA: Diagnosis not present

## 2019-07-15 DIAGNOSIS — B351 Tinea unguium: Secondary | ICD-10-CM | POA: Diagnosis not present

## 2019-07-15 DIAGNOSIS — M79676 Pain in unspecified toe(s): Secondary | ICD-10-CM | POA: Diagnosis not present

## 2019-07-16 DIAGNOSIS — I482 Chronic atrial fibrillation, unspecified: Secondary | ICD-10-CM | POA: Diagnosis not present

## 2019-08-16 DIAGNOSIS — G9009 Other idiopathic peripheral autonomic neuropathy: Secondary | ICD-10-CM | POA: Diagnosis not present

## 2019-08-16 DIAGNOSIS — I10 Essential (primary) hypertension: Secondary | ICD-10-CM | POA: Diagnosis not present

## 2019-08-16 DIAGNOSIS — E11610 Type 2 diabetes mellitus with diabetic neuropathic arthropathy: Secondary | ICD-10-CM | POA: Diagnosis not present

## 2019-08-17 DIAGNOSIS — I482 Chronic atrial fibrillation, unspecified: Secondary | ICD-10-CM | POA: Diagnosis not present

## 2019-08-17 DIAGNOSIS — I10 Essential (primary) hypertension: Secondary | ICD-10-CM | POA: Diagnosis not present

## 2019-08-17 DIAGNOSIS — E11610 Type 2 diabetes mellitus with diabetic neuropathic arthropathy: Secondary | ICD-10-CM | POA: Diagnosis not present

## 2019-08-17 DIAGNOSIS — J3089 Other allergic rhinitis: Secondary | ICD-10-CM | POA: Diagnosis not present

## 2019-08-17 DIAGNOSIS — R911 Solitary pulmonary nodule: Secondary | ICD-10-CM | POA: Diagnosis not present

## 2019-08-17 DIAGNOSIS — G9009 Other idiopathic peripheral autonomic neuropathy: Secondary | ICD-10-CM | POA: Diagnosis not present

## 2019-08-23 DIAGNOSIS — I700 Atherosclerosis of aorta: Secondary | ICD-10-CM | POA: Diagnosis not present

## 2019-08-23 DIAGNOSIS — R918 Other nonspecific abnormal finding of lung field: Secondary | ICD-10-CM | POA: Diagnosis not present

## 2019-08-23 DIAGNOSIS — R911 Solitary pulmonary nodule: Secondary | ICD-10-CM | POA: Diagnosis not present

## 2019-08-23 DIAGNOSIS — R590 Localized enlarged lymph nodes: Secondary | ICD-10-CM | POA: Diagnosis not present

## 2019-08-23 DIAGNOSIS — J439 Emphysema, unspecified: Secondary | ICD-10-CM | POA: Diagnosis not present

## 2019-09-17 DIAGNOSIS — I10 Essential (primary) hypertension: Secondary | ICD-10-CM | POA: Diagnosis not present

## 2019-09-17 DIAGNOSIS — E11610 Type 2 diabetes mellitus with diabetic neuropathic arthropathy: Secondary | ICD-10-CM | POA: Diagnosis not present

## 2019-09-17 DIAGNOSIS — I482 Chronic atrial fibrillation, unspecified: Secondary | ICD-10-CM | POA: Diagnosis not present

## 2019-09-17 DIAGNOSIS — G9009 Other idiopathic peripheral autonomic neuropathy: Secondary | ICD-10-CM | POA: Diagnosis not present

## 2019-10-01 DIAGNOSIS — E11610 Type 2 diabetes mellitus with diabetic neuropathic arthropathy: Secondary | ICD-10-CM | POA: Diagnosis not present

## 2019-10-01 DIAGNOSIS — I10 Essential (primary) hypertension: Secondary | ICD-10-CM | POA: Diagnosis not present

## 2019-10-01 DIAGNOSIS — G9009 Other idiopathic peripheral autonomic neuropathy: Secondary | ICD-10-CM | POA: Diagnosis not present

## 2019-10-19 DIAGNOSIS — L84 Corns and callosities: Secondary | ICD-10-CM | POA: Diagnosis not present

## 2019-10-19 DIAGNOSIS — E1142 Type 2 diabetes mellitus with diabetic polyneuropathy: Secondary | ICD-10-CM | POA: Diagnosis not present

## 2019-10-19 DIAGNOSIS — M79676 Pain in unspecified toe(s): Secondary | ICD-10-CM | POA: Diagnosis not present

## 2019-10-19 DIAGNOSIS — B351 Tinea unguium: Secondary | ICD-10-CM | POA: Diagnosis not present

## 2019-10-22 ENCOUNTER — Emergency Department (HOSPITAL_COMMUNITY): Payer: Medicare Other

## 2019-10-22 ENCOUNTER — Emergency Department (HOSPITAL_COMMUNITY)
Admission: EM | Admit: 2019-10-22 | Discharge: 2019-10-22 | Disposition: A | Discharge status: Home / Self Care | Payer: Medicare Other | Attending: Emergency Medicine | Admitting: Emergency Medicine

## 2019-10-22 ENCOUNTER — Other Ambulatory Visit: Payer: Self-pay

## 2019-10-22 ENCOUNTER — Encounter (HOSPITAL_COMMUNITY): Payer: Self-pay | Admitting: *Deleted

## 2019-10-22 DIAGNOSIS — Z87891 Personal history of nicotine dependence: Secondary | ICD-10-CM | POA: Diagnosis not present

## 2019-10-22 DIAGNOSIS — R339 Retention of urine, unspecified: Secondary | ICD-10-CM | POA: Insufficient documentation

## 2019-10-22 DIAGNOSIS — Z7901 Long term (current) use of anticoagulants: Secondary | ICD-10-CM | POA: Insufficient documentation

## 2019-10-22 DIAGNOSIS — E119 Type 2 diabetes mellitus without complications: Secondary | ICD-10-CM | POA: Insufficient documentation

## 2019-10-22 DIAGNOSIS — R300 Dysuria: Secondary | ICD-10-CM | POA: Diagnosis present

## 2019-10-22 LAB — CBC
HCT: 44.2 % (ref 39.0–52.0)
Hemoglobin: 14.6 g/dL (ref 13.0–17.0)
MCH: 31.1 pg (ref 26.0–34.0)
MCHC: 33 g/dL (ref 30.0–36.0)
MCV: 94.2 fL (ref 80.0–100.0)
Platelets: 231 10*3/uL (ref 150–400)
RBC: 4.69 MIL/uL (ref 4.22–5.81)
RDW: 12.9 % (ref 11.5–15.5)
WBC: 8.8 10*3/uL (ref 4.0–10.5)
nRBC: 0 % (ref 0.0–0.2)

## 2019-10-22 LAB — URINALYSIS, ROUTINE W REFLEX MICROSCOPIC
Bilirubin Urine: NEGATIVE
Glucose, UA: NEGATIVE mg/dL
Hgb urine dipstick: NEGATIVE
Ketones, ur: NEGATIVE mg/dL
Leukocytes,Ua: NEGATIVE
Nitrite: NEGATIVE
Protein, ur: NEGATIVE mg/dL
Specific Gravity, Urine: 1.012 (ref 1.005–1.030)
pH: 6 (ref 5.0–8.0)

## 2019-10-22 LAB — COMPREHENSIVE METABOLIC PANEL
ALT: 20 U/L (ref 0–44)
AST: 25 U/L (ref 15–41)
Albumin: 4.1 g/dL (ref 3.5–5.0)
Alkaline Phosphatase: 62 U/L (ref 38–126)
Anion gap: 13 (ref 5–15)
BUN: 40 mg/dL — ABNORMAL HIGH (ref 8–23)
CO2: 26 mmol/L (ref 22–32)
Calcium: 9.7 mg/dL (ref 8.9–10.3)
Chloride: 96 mmol/L — ABNORMAL LOW (ref 98–111)
Creatinine, Ser: 1.66 mg/dL — ABNORMAL HIGH (ref 0.61–1.24)
GFR calc Af Amer: 43 mL/min — ABNORMAL LOW (ref >60)
GFR calc non Af Amer: 37 mL/min — ABNORMAL LOW (ref >60)
Glucose, Bld: 130 mg/dL — ABNORMAL HIGH (ref 70–99)
Potassium: 3.8 mmol/L (ref 3.5–5.1)
Sodium: 135 mmol/L (ref 135–145)
Total Bilirubin: 0.6 mg/dL (ref 0.3–1.2)
Total Protein: 7.9 g/dL (ref 6.5–8.1)

## 2019-10-22 LAB — PROTIME-INR
INR: 2.7 — ABNORMAL HIGH (ref 0.8–1.2)
Prothrombin Time: 28.4 seconds — ABNORMAL HIGH (ref 11.4–15.2)

## 2019-10-22 NOTE — ED Notes (Signed)
Patient given a urinary drainage  leg bag to  take home. Patient's urine bag emptied before discharge. Patient had 500 ml output.

## 2019-10-22 NOTE — ED Notes (Signed)
Patient waiting on EMS transport home.

## 2019-10-22 NOTE — ED Notes (Signed)
Patient had 1 small bowel movement. Patient cleaned and patient's brief changed. Patient repositioned in bed. Patient resting at this time.

## 2019-10-22 NOTE — ED Notes (Signed)
Patient had 2 liters of urine output in foley cath.

## 2019-10-22 NOTE — ED Provider Notes (Signed)
[facility] Emergency Department Provider Note MRN:  [id number]  Arrival date & time: 10/22/19     Chief Complaint   Dysuria   History of Present Illness   Jeremy Vargas is a 85 y.o. year-old male with a history of compression fractures, A. fib presenting to the ED with chief complaint of dysuria.  Patient has been unable to void urine since 4 AM this morning.  This is never happened before.  Endorsing worsening lower abdominal pain and fullness.  Endorsing a mild fall where he slipped out of bed later in the day.  Also with new onset bowel incontinence today.  Denies fever, no neck pain, no chest pain or shortness of breath, has neuropathy to the legs chronically but has not noticed any new numbness or weakness.  Review of Systems  A complete 10 system review of systems was obtained and all systems are negative except as noted in the HPI and PMH.   Patient's Health History    Past Medical History:  Diagnosis Date  . Atrial fibrillation ([place])   . Cardiac dysrhythmia, unspecified   . Compression fracture of L4 lumbar vertebra   . Compression fracture of T12 vertebra (HCC)   . Diabetes mellitus without complication ([place])   . Other and unspecified hyperlipidemia   . Sleep apnea   . SVT (supraventricular tachycardia) ([place])   . Unspecified essential hypertension   . Vitamin D deficiency     Past Surgical History:  Procedure Laterality Date  . ABLATION     x2  . CARDIOVERSION     x2  . HIATAL HERNIA REPAIR     x2  . KYPHOPLASTY    . SKIN CANCER EXCISION      Family History  Problem Relation Age of Onset  . COPD Mother   . Cancer Father   . Heart disease Son   . Heart disease Maternal Grandmother     Social History   Socioeconomic History  . Marital status: Married    Spouse name: Not on file  . Number of children: 3  . Years of education: Not on file  . Highest education level: Not on file  Occupational History  . Not on file  Social  Needs  . Financial resource strain: Not on file  . Food insecurity    Worry: Not on file    Inability: Not on file  . Transportation needs    Medical: Not on file    Non-medical: Not on file  Tobacco Use  . Smoking status: Former Research scientist (life sciences)  . Smokeless tobacco: Never Used  Substance and Sexual Activity  . Alcohol use: No  . Drug use: No  . Sexual activity: Not on file  Lifestyle  . Physical activity    Days per week: Not on file    Minutes per session: Not on file  . Stress: Not on file  Relationships  . Social Herbalist on phone: Not on file    Gets together: Not on file    Attends religious service: Not on file    Active member of club or organization: Not on file    Attends meetings of clubs or organizations: Not on file    Relationship status: Not on file  . Intimate partner violence    Fear of current or ex partner: Not on file    Emotionally abused: Not on file    Physically abused: Not on file    Forced sexual activity: Not  on file  Other Topics Concern  . Not on file  Social History Narrative  . Not on file     Physical Exam  Vital Signs and Nursing Notes reviewed Vitals:   10/22/19 1751  BP: (!) 188/93  Pulse: 81  Resp: 15  Temp: (!) 97.5 F (36.4 C)  SpO2: 100%    CONSTITUTIONAL: Well-appearing, NAD NEURO:  Alert and oriented x 3, no focal deficits EYES:  eyes equal and reactive ENT/NECK:  no LAD, no JVD CARDIO: Regular rate, well-perfused, normal S1 and S2 PULM:  CTAB no wheezing or rhonchi GI/GU:  normal bowel sounds, suprapubic distention and tenderness MSK/SPINE:  No gross deformities, no edema SKIN:  no rash, atraumatic PSYCH:  Appropriate speech and behavior  Diagnostic and Interventional Summary    EKG Interpretation  Date/Time:    Ventricular Rate:    PR Interval:    QRS Duration:   QT Interval:    QTC Calculation:   R Axis:     Text Interpretation:        Labs Reviewed  COMPREHENSIVE METABOLIC PANEL - Abnormal;  Notable for the following components:      Result Value   Chloride 96 (*)    Glucose, Bld 130 (*)    BUN 40 (*)    Creatinine, Ser 1.66 (*)    GFR calc non Af Amer 37 (*)    GFR calc Af Amer 43 (*)    All other components within normal limits  PROTIME-INR - Abnormal; Notable for the following components:   Prothrombin Time 28.4 (*)    INR 2.7 (*)    All other components within normal limits  URINE CULTURE  CBC  URINALYSIS, ROUTINE W REFLEX MICROSCOPIC    MR THORACIC SPINE WO CONTRAST  Final Result    MR LUMBAR SPINE WO CONTRAST  Final Result      Medications - No data to display   Procedures  /  Critical Care Procedures  ED Course and Medical Decision Making  I have reviewed the triage vital signs and the nursing notes.  Pertinent labs & imaging results that were available during my care of the patient were reviewed by me and considered in my medical decision making (see below for details).     86 years old, anticoagulated, here today with new onset of bowel incontinence and urinary retention, has extensive compression fracture history, has chronic back pain but states it is largely unchanged.  Decreased sensation to bilateral legs but seems chronically related to his neuropathy.  Patient symptoms most likely explained by BPH but he has no history of this.  An enlarged bladder could have caused discomfort and he could have simply had a bowel movement in his depends rather than get out of bed.  But there is also concern for cauda equina or myelopathy, possibly shift and compression fracture on the cord or possibly spontaneous epidural hematoma related to his anticoagulation.  Will need MRI imaging to exclude.  Foley catheter is placed and patient is much more comfortable.  Work-up is unrevealing, normal labs, normal urinalysis, culture pending.  MRI revealing chronic compression fractures but no cord involvement.  With cauda equina ruled out, I suspect BPH as the etiology of  patient's symptoms.  Patient is very comfortable and will go home with Foley catheter to follow-up with urology.  Barth Kirks. Sedonia Small, Amasa [email or web address]  Final Clinical Impressions(s) / ED Diagnoses     ICD-10-CM  1. Urinary retention  R33.9     ED Discharge Orders    None       Discharge Instructions Discussed with and Provided to Patient:     Discharge Instructions     You were evaluated in the Emergency Department and after careful evaluation, we did not find any emergent condition requiring admission or further testing in the hospital.  Your exam/testing today is overall reassuring.  Your symptoms were due to urinary retention, possibly due to enlargement of the prostate.  Please maintain the Foley catheter and follow-up with the urologists for further management.  Please return to the Emergency Department if you experience any worsening of your condition.  We encourage you to follow up with a primary care provider.  Thank you for allowing [place] to be a part of your care.       Maudie Flakes, MD 10/22/19 2105

## 2019-10-22 NOTE — Discharge Instructions (Signed)
You were evaluated in the Emergency Department and after careful evaluation, we did not find any emergent condition requiring admission or further testing in the hospital.  Your exam/testing today is overall reassuring.  Your symptoms were due to urinary retention, possibly due to enlargement of the prostate.  Please maintain the Foley catheter and follow-up with the urologists for further management.  Please return to the Emergency Department if you experience any worsening of your condition.  We encourage you to follow up with a primary care provider.  Thank you for allowing [place] to be a part of your care.

## 2019-10-24 LAB — URINE CULTURE: Culture: NO GROWTH

## 2019-11-07 ENCOUNTER — Other Ambulatory Visit: Payer: Self-pay | Admitting: Cardiology

## 2019-11-09 ENCOUNTER — Ambulatory Visit (INDEPENDENT_AMBULATORY_CARE_PROVIDER_SITE_OTHER): Payer: Medicare Other | Admitting: Urology

## 2019-11-09 ENCOUNTER — Encounter: Payer: Self-pay | Admitting: Urology

## 2019-11-09 VITALS — BP 108/68 | HR 97 | Temp 97.2°F

## 2019-11-09 DIAGNOSIS — N401 Enlarged prostate with lower urinary tract symptoms: Secondary | ICD-10-CM | POA: Diagnosis not present

## 2019-11-09 DIAGNOSIS — R338 Other retention of urine: Secondary | ICD-10-CM | POA: Diagnosis not present

## 2019-11-09 MED ORDER — TAMSULOSIN HCL 0.4 MG PO CAPS: 0.4000 mg | ORAL_CAPSULE | Freq: Every day | ORAL | 11 refills | Status: DC

## 2019-11-09 NOTE — Progress Notes (Signed)
H&P  Chief Complaint: Urinary Retention  History of Present Illness: This man presents today having recently had an acute retentive episode for which he presented to the ER. He states that he was unable to urinate the day of 12.4.2020, which began to produce increasingly severe lower abdominal pain. That night he went to the ER c/o inability to empty his bladder -- he was placed on indwelling cath and he is here today for follow-up and TOV. He was not started on tamsulosin per the ER. He denies any constipation or use of decongestants around the time of this acute episode.   He reports a hx of urinary issues. Of note, he has recently had to practice double voiding in order to fully empty his bladder and his stream has grown progressively weaker over the last few years. He denies a family hx of prostate cancer and has never had a DRE or PSA. 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  Past Medical History:  Diagnosis Date  . Atrial fibrillation ([place])   . Cardiac dysrhythmia, unspecified   . Compression fracture of L4 lumbar vertebra   . Compression fracture of T12 vertebra (HCC)   . Diabetes mellitus without complication ([place])   . Other and unspecified hyperlipidemia   . Sleep apnea   . SVT (supraventricular tachycardia) ([place])   . Unspecified essential hypertension   . Vitamin D deficiency     Past Surgical History:  Procedure Laterality Date  . ABLATION     x2  . CARDIOVERSION     x2  . HIATAL HERNIA REPAIR     x2  . KYPHOPLASTY    . SKIN CANCER EXCISION      Home Medications:  Allergies as of 11/09/2019      Reactions   Betadine [povidone Iodine] Rash      Medication List       Accurate as of November 09, 2019  2:22 PM. If you have any questions, ask your nurse or doctor.        albuterol (2.5 MG/3ML) 0.083% nebulizer solution Commonly known as: PROVENTIL Inhale 2.5 mg into the lungs every 2 (two)  hours as needed.   BIOTIN PO Take 1 capsule by mouth daily.   CRANBERRY EXTRACT PO Take 1 capsule by mouth daily.   diltiazem 240 MG 24 hr capsule Commonly known as: CARDIZEM CD TAKE ONE (1) CAPSULE EACH DAY   Fish Oil 1000 MG Caps Take 1 capsule by mouth daily.   furosemide 40 MG tablet Commonly known as: LASIX Take 40 mg by mouth daily.   metFORMIN 1000 MG tablet Commonly known as: GLUCOPHAGE Take 1,000 mg by mouth 2 (two) times daily with a meal.   multivitamin tablet Take 1 tablet by mouth daily.   warfarin 5 MG tablet Commonly known as: COUMADIN Take 5 mg by mouth daily.       Allergies:  Allergies  Allergen Reactions  . Betadine [Povidone Iodine] Rash    Family History  Problem Relation Age of Onset  . COPD Mother   . Cancer Father   . Heart disease Son   . Heart disease Maternal Grandmother     Social History:  reports that he has quit smoking. He has never used smokeless tobacco. He reports that he does not drink alcohol or use drugs.  ROS: A complete review of systems was performed.  All systems are negative except for pertinent findings as  noted.  Physical Exam:  Vital signs in last 24 hours: BP 108/68   Pulse 97   Temp (!) 97.2 F (36.2 C)  Constitutional:  Alert and oriented, No acute distress Cardiovascular: Regular rate  Respiratory: Normal respiratory effort GI: Abdomen is soft, nontender, nondistended, no abdominal masses. No CVAT.  Genitourinary: Prostate seems around 30 g in size. DRE normal.  Lymphatic: No lymphadenopathy Neurologic: Grossly intact, no focal deficits Psychiatric: Normal mood and affect  Laboratory Data:  No results for input(s): WBC, HGB, HCT, PLT in the last 72 hours.  No results for input(s): NA, K, CL, GLUCOSE, BUN, CALCIUM, CREATININE in the last 72 hours.  Invalid input(s): CO3   No results found for this or any previous visit (from the past 24 hour(s)). No results found for this or any previous visit  (from the past 240 hour(s)).  Renal Function: No results for input(s): CREATININE in the last 168 hours. CrCl cannot be calculated (Unknown ideal weight.).  Radiologic Imaging: No results found.  Impression/Assessment: BPH w/ retention He has not been on tamsulosin and if I removed his catheter today it is very likely that he would go into retention again. He will start on flomax (he was warned about potential side effects, keeping in mind his reduced mobility) and return next week for TOV. Prostate exam normal -- it is unlikely that his urinary sx's are related to obstructive enlargement of his prostate.   Plan:  1. Start on tamsulosin -- he was warned about the dizziness it could cause.  2. Return in 1 wk for TOV -- he may need to change this pending availability of his transportation.

## 2019-11-10 ENCOUNTER — Ambulatory Visit: Payer: Medicare Other | Admitting: Urology

## 2019-11-17 ENCOUNTER — Other Ambulatory Visit: Payer: Self-pay

## 2019-11-17 ENCOUNTER — Ambulatory Visit (INDEPENDENT_AMBULATORY_CARE_PROVIDER_SITE_OTHER): Payer: Medicare Other

## 2019-11-17 DIAGNOSIS — R339 Retention of urine, unspecified: Secondary | ICD-10-CM | POA: Diagnosis not present

## 2019-11-17 NOTE — Progress Notes (Addendum)
Uroflow  Peak Flow: 58ml Average Flow: 3ml Voided Volume: 173ml Voiding Time: 67sec Flow Time: 47 sec Time to Peak Flow: 14sec  Instilled 400cc of sterile water. Pt voided 150cc. Spoke with Dr. Alyson Ingles who instructed pt could learn CIC. Pt was taught CIC as well as pt wife a Therapist, sports as well to help assist pt at home if CIC is needed. Pt voiced understanding. Will return in 1 month to see Dr. Diona Fanti for f/u

## 2019-11-25 ENCOUNTER — Encounter: Payer: Self-pay | Admitting: Urology

## 2019-12-02 DIAGNOSIS — I10 Essential (primary) hypertension: Secondary | ICD-10-CM | POA: Diagnosis not present

## 2019-12-02 DIAGNOSIS — E11610 Type 2 diabetes mellitus with diabetic neuropathic arthropathy: Secondary | ICD-10-CM | POA: Diagnosis not present

## 2019-12-02 DIAGNOSIS — G9009 Other idiopathic peripheral autonomic neuropathy: Secondary | ICD-10-CM | POA: Diagnosis not present

## 2019-12-08 ENCOUNTER — Encounter (INDEPENDENT_AMBULATORY_CARE_PROVIDER_SITE_OTHER): Payer: Self-pay | Admitting: Gastroenterology

## 2019-12-14 ENCOUNTER — Other Ambulatory Visit: Payer: Self-pay

## 2019-12-14 ENCOUNTER — Other Ambulatory Visit (HOSPITAL_COMMUNITY)
Admission: AD | Admit: 2019-12-14 | Discharge: 2019-12-14 | Disposition: A | Discharge status: Home / Self Care | Payer: Medicare Other | Source: Other Acute Inpatient Hospital | Attending: Urology | Admitting: Urology

## 2019-12-14 ENCOUNTER — Ambulatory Visit (INDEPENDENT_AMBULATORY_CARE_PROVIDER_SITE_OTHER): Payer: Medicare Other | Admitting: Urology

## 2019-12-14 VITALS — BP 120/70 | HR 102 | Temp 96.3°F | Ht 70.0 in | Wt 185.0 lb

## 2019-12-14 DIAGNOSIS — R339 Retention of urine, unspecified: Secondary | ICD-10-CM | POA: Diagnosis not present

## 2019-12-14 LAB — POCT URINALYSIS DIPSTICK
Bilirubin, UA: NEGATIVE
Glucose, UA: NEGATIVE
Ketones, UA: NEGATIVE
Nitrite, UA: NEGATIVE
Protein, UA: POSITIVE — AB
Spec Grav, UA: 1.02 (ref 1.010–1.025)
Urobilinogen, UA: NEGATIVE E.U./dL — AB
pH, UA: 6 (ref 5.0–8.0)

## 2019-12-14 LAB — BLADDER SCAN AMB NON-IMAGING: Scan Result: 341.9

## 2019-12-14 NOTE — Progress Notes (Addendum)
H&P  Chief Complaint: Urinary Retention  History of Present Illness:   1.26.2021: He returns today for follow-up after removal of his catheter and TOV on 12.30.20. Based on TOV results it was decided that he should be taught CIC, but he reports that he has not been using this much if at all. He notes that that he has been having urine that is cloudy at the start of his stream and also reports that he was previously experiencing pain at the tip of his penis when urinating but this has resolved. He self-medicated himself for these sx's with D-mannose and attributes the resolution of his dysuria to this. Also of note, he has some difficulties with urination unless he has a BM. He experiences increased freq of urinary urge until he has a BM and feels as though he cannot start his stream until he has a BM. Overall, he feels as though his urinary pattern and sx's have worsened over the last year. He continues on tamsulosin 1x nightly.    IPSS Questionnaire (AUA-7): Over the past month.   1)  How often have you had a sensation of not emptying your bladder completely after you finish urinating?  5 - Almost always  2)  How often have you had to urinate again less than two hours after you finished urinating? 5 - Almost always  3)  How often have you found you stopped and started again several times when you urinated?  0 - Not at all  4) How difficult have you found it to postpone urination?  0 - Not at all  5) How often have you had a weak urinary stream?  3 - About half the time  6) How often have you had to push or strain to begin urination?  0 - Not at all  7) How many times did you most typically get up to urinate from the time you went to bed until the time you got up in the morning?  1 - 1 time  Total score:  0-7 mildly symptomatic   8-19 moderately symptomatic   20-35 severely symptomatic   IPSS: 14  He has the below noted hx with urinary retention.   12.20.2021: This man presents today having  recently had an acute retentive episode for which he presented to the ER. He states that he was unable to urinate the day of 12.4.2020, which began to produce increasingly severe lower abdominal pain. That night he went to the ER c/o inability to empty his bladder -- he was placed on indwelling cath and he is here today for follow-up and TOV. He was not started on tamsulosin per the ER. He denies any constipation or use of decongestants around the time of this acute episode.    He reports a hx of urinary issues. Of note, he has recently had to practice double voiding in order to fully empty his bladder and his stream has grown progressively weaker over the last few years. He denies a family hx of prostate cancer and has never had a DRE or PSA. 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   Past Medical History:  Diagnosis Date  . Atrial fibrillation ([place])   . Cardiac dysrhythmia, unspecified   . Compression fracture of L4 lumbar vertebra   . Compression fracture of T12 vertebra (HCC)   . Diabetes mellitus without complication ([place])   . Other and unspecified hyperlipidemia   .  Sleep apnea   . SVT (supraventricular tachycardia) ([place])   . Unspecified essential hypertension   . Vitamin D deficiency     Past Surgical History:  Procedure Laterality Date  . ABLATION     x2  . CARDIOVERSION     x2  . HIATAL HERNIA REPAIR     x2  . KYPHOPLASTY    . SKIN CANCER EXCISION      Home Medications:  Allergies as of 12/14/2019       Reactions   Betadine [povidone Iodine] Rash        Medication List        Accurate as of December 14, 2019  4:15 PM. If you have any questions, ask your nurse or doctor.          STOP taking these medications    CRANBERRY EXTRACT PO Stopped by: Jorja Loa, MD       TAKE these medications    albuterol (2.5 MG/3ML) 0.083% nebulizer solution Commonly known as:  PROVENTIL Inhale 2.5 mg into the lungs every 2 (two) hours as needed.   BIOTIN PO Take 1 capsule by mouth daily.   D-Mannose 500 MG Caps Take by mouth.   diltiazem 240 MG 24 hr capsule Commonly known as: CARDIZEM CD TAKE ONE (1) CAPSULE EACH DAY   Fish Oil 1000 MG Caps Take 1 capsule by mouth daily.   furosemide 40 MG tablet Commonly known as: LASIX Take 40 mg by mouth daily.   metFORMIN 1000 MG tablet Commonly known as: GLUCOPHAGE Take 1,000 mg by mouth 2 (two) times daily with a meal.   multivitamin tablet Take 1 tablet by mouth daily.   tamsulosin 0.4 MG Caps capsule Commonly known as: FLOMAX Take 1 capsule (0.4 mg total) by mouth daily after supper.   warfarin 5 MG tablet Commonly known as: COUMADIN Take 5 mg by mouth daily.        Allergies:  Allergies  Allergen Reactions  . Betadine [Povidone Iodine] Rash    Family History  Problem Relation Age of Onset  . COPD Mother   . Cancer Father   . Heart disease Son   . Heart disease Maternal Grandmother     Social History:  reports that he has quit smoking. He has never used smokeless tobacco. He reports that he does not drink alcohol or use drugs.  ROS: A complete review of systems was performed.  All systems are negative except for pertinent findings as noted.  Physical Exam:  Vital signs in last 24 hours: BP 120/70   Pulse (!) 102   Temp (!) 96.3 F (35.7 C)   Ht 5\' 10"  (1.778 m)   Wt 185 lb (83.9 kg)   BMI 26.54 kg/m  Constitutional:  Alert and oriented, No acute distress, Uses cane for balance/mobility.  Cardiovascular: Regular rate  Respiratory: Normal respiratory effort GI: Abdomen is soft, nontender, nondistended, no abdominal masses. No CVAT.  Genitourinary: Normal male phallus, testes are descended bilaterally and non-tender and without masses, scrotum is normal in appearance without lesions or masses, perineum is normal on inspection. Lymphatic: No lymphadenopathy Neurologic: Grossly  intact, no focal deficits Psychiatric: Normal mood and affect  Laboratory Data:  No results for input(s): WBC, HGB, HCT, PLT in the last 72 hours.  No results for input(s): NA, K, CL, GLUCOSE, BUN, CALCIUM, CREATININE in the last 72 hours.  Invalid input(s): CO3   Results for orders placed or performed in visit on 12/14/19 (from the past  24 hour(s))  POCT urinalysis dipstick     Status: Abnormal   Collection Time: 12/14/19  3:56 PM  Result Value Ref Range   Color, UA yellow    Clarity, UA     Glucose, UA Negative Negative   Bilirubin, UA neg    Ketones, UA neg    Spec Grav, UA 1.020 1.010 - 1.025   Blood, UA 3+    pH, UA 6.0 5.0 - 8.0   Protein, UA Positive (A) Negative   Urobilinogen, UA negative (A) 0.2 or 1.0 E.U./dL   Nitrite, UA neg    Leukocytes, UA Large (3+) (A) Negative   Appearance hazy    Odor     No results found for this or any previous visit (from the past 240 hour(s)).   I have reviewed prior pt notes  I have reviewed urinalysis results  Impression/Assessment:  PVR today was 10 oz's and urine seems colonized. This could be secondary to decreased bladder fxn, obstruction from prostatic hypertrophy, or even his recent spinal injury (I do no suspect that the latter is very likely). UA indicates possible infection -- though he is asymptomatic -- so we will delay evaluation with cysto.   Plan:  1. Urine culture -- will call with results and prescribe short term abx followed by maintenance dosage up until his cystoscopy.   2. Return in 2-3 weeks for cysto as availability permits

## 2019-12-14 NOTE — Progress Notes (Addendum)
Urological Symptom Review  Patient is experiencing the following symptoms: none   Review of Systems  Gastrointestinal (upper)  : Indigestion/heartburn  Gastrointestinal (lower) : Negative for lower GI symptoms  Constitutional : Fatigue  Skin: Negative for skin symptoms  Eyes: Negative for eye symptoms  Ear/Nose/Throat : Negative for Ear/Nose/Throat symptoms  Hematologic/Lymphatic: Easy bruising  Cardiovascular : Negative for cardiovascular symptoms  Respiratory : Negative for respiratory symptoms  Endocrine: Negative for endocrine symptoms  Musculoskeletal: Negative for musculoskeletal symptoms  Neurological: Negative for neurological symptoms  Psychologic: Negative for psychiatric symptoms

## 2019-12-15 DIAGNOSIS — E11610 Type 2 diabetes mellitus with diabetic neuropathic arthropathy: Secondary | ICD-10-CM | POA: Diagnosis not present

## 2019-12-15 DIAGNOSIS — I10 Essential (primary) hypertension: Secondary | ICD-10-CM | POA: Diagnosis not present

## 2019-12-15 DIAGNOSIS — J449 Chronic obstructive pulmonary disease, unspecified: Secondary | ICD-10-CM | POA: Diagnosis not present

## 2019-12-15 DIAGNOSIS — I25118 Atherosclerotic heart disease of native coronary artery with other forms of angina pectoris: Secondary | ICD-10-CM | POA: Diagnosis not present

## 2019-12-15 DIAGNOSIS — J3089 Other allergic rhinitis: Secondary | ICD-10-CM | POA: Diagnosis not present

## 2019-12-15 DIAGNOSIS — I700 Atherosclerosis of aorta: Secondary | ICD-10-CM | POA: Diagnosis not present

## 2019-12-15 DIAGNOSIS — G9009 Other idiopathic peripheral autonomic neuropathy: Secondary | ICD-10-CM | POA: Diagnosis not present

## 2019-12-15 DIAGNOSIS — I482 Chronic atrial fibrillation, unspecified: Secondary | ICD-10-CM | POA: Diagnosis not present

## 2019-12-15 DIAGNOSIS — R911 Solitary pulmonary nodule: Secondary | ICD-10-CM | POA: Diagnosis not present

## 2019-12-16 LAB — URINE CULTURE: Culture: 50000 — AB

## 2019-12-21 ENCOUNTER — Telehealth: Payer: Self-pay

## 2019-12-21 ENCOUNTER — Other Ambulatory Visit: Payer: Self-pay | Admitting: Urology

## 2019-12-21 DIAGNOSIS — N3000 Acute cystitis without hematuria: Secondary | ICD-10-CM

## 2019-12-21 MED ORDER — AMOXICILLIN 500 MG PO CAPS: 500.0000 mg | ORAL_CAPSULE | Freq: Every morning | ORAL | 0 refills | Status: AC

## 2019-12-21 MED ORDER — NITROFURANTOIN MONOHYD MACRO 100 MG PO CAPS: 100.0000 mg | ORAL_CAPSULE | Freq: Two times a day (BID) | ORAL | 0 refills | Status: AC

## 2019-12-21 NOTE — Telephone Encounter (Signed)
-----   Message from Franchot Gallo, MD sent at 12/21/2019  1:12 PM EST ----- PLease call--I sent in 1 week of macrobid to take first, then amx to take daily until he comes in here ----- Message ----- From: Dorisann Frames, RN Sent: 12/21/2019   1:02 PM EST To: Franchot Gallo, MD  Urine culture

## 2019-12-21 NOTE — Telephone Encounter (Signed)
Left message to return call 

## 2019-12-21 NOTE — Telephone Encounter (Signed)
Pt notified. Voiced understanding of prescriptions and how to take.

## 2019-12-23 DIAGNOSIS — L84 Corns and callosities: Secondary | ICD-10-CM | POA: Diagnosis not present

## 2019-12-23 DIAGNOSIS — E1142 Type 2 diabetes mellitus with diabetic polyneuropathy: Secondary | ICD-10-CM | POA: Diagnosis not present

## 2019-12-23 DIAGNOSIS — B351 Tinea unguium: Secondary | ICD-10-CM | POA: Diagnosis not present

## 2019-12-23 DIAGNOSIS — M79676 Pain in unspecified toe(s): Secondary | ICD-10-CM | POA: Diagnosis not present

## 2019-12-24 ENCOUNTER — Other Ambulatory Visit: Payer: Self-pay | Admitting: Urology

## 2019-12-24 ENCOUNTER — Telehealth: Payer: Self-pay

## 2019-12-24 DIAGNOSIS — N3000 Acute cystitis without hematuria: Secondary | ICD-10-CM

## 2019-12-24 MED ORDER — AMOXICILLIN 250 MG PO CAPS: 250.0000 mg | ORAL_CAPSULE | Freq: Three times a day (TID) | ORAL | 0 refills | Status: DC

## 2019-12-24 NOTE — Telephone Encounter (Signed)
Tell them a it is okay to stop Macrobid, I sent amoxicillin and instead.  The drug store in [place]

## 2019-12-24 NOTE — Telephone Encounter (Signed)
Wife states he has had a mental change since starting the macrobid. Message sent to Dr. Diona Fanti.

## 2019-12-24 NOTE — Telephone Encounter (Signed)
Wife notified.

## 2019-12-27 DIAGNOSIS — J449 Chronic obstructive pulmonary disease, unspecified: Secondary | ICD-10-CM | POA: Diagnosis not present

## 2019-12-27 DIAGNOSIS — E11610 Type 2 diabetes mellitus with diabetic neuropathic arthropathy: Secondary | ICD-10-CM | POA: Diagnosis not present

## 2019-12-27 DIAGNOSIS — I10 Essential (primary) hypertension: Secondary | ICD-10-CM | POA: Diagnosis not present

## 2020-01-11 ENCOUNTER — Ambulatory Visit (INDEPENDENT_AMBULATORY_CARE_PROVIDER_SITE_OTHER): Payer: Medicare Other | Admitting: Urology

## 2020-01-11 ENCOUNTER — Other Ambulatory Visit: Payer: Self-pay

## 2020-01-11 ENCOUNTER — Encounter: Payer: Self-pay | Admitting: Urology

## 2020-01-11 VITALS — BP 107/67 | HR 80 | Temp 97.5°F

## 2020-01-11 DIAGNOSIS — N138 Other obstructive and reflux uropathy: Secondary | ICD-10-CM | POA: Diagnosis not present

## 2020-01-11 DIAGNOSIS — R339 Retention of urine, unspecified: Secondary | ICD-10-CM | POA: Diagnosis not present

## 2020-01-11 DIAGNOSIS — N401 Enlarged prostate with lower urinary tract symptoms: Secondary | ICD-10-CM | POA: Diagnosis not present

## 2020-01-11 LAB — POCT URINALYSIS DIPSTICK
Bilirubin, UA: NEGATIVE
Glucose, UA: NEGATIVE
Ketones, UA: NEGATIVE
Nitrite, UA: NEGATIVE
Protein, UA: NEGATIVE
Spec Grav, UA: 1.01 (ref 1.010–1.025)
Urobilinogen, UA: NEGATIVE E.U./dL — AB
pH, UA: 8.5 — AB (ref 5.0–8.0)

## 2020-01-11 MED ORDER — CIPROFLOXACIN HCL 500 MG PO TABS
500.0000 mg | ORAL_TABLET | Freq: Once | ORAL | Status: AC
  Administered 2020-01-11: 17:00:00 500 mg via ORAL

## 2020-01-11 NOTE — Progress Notes (Signed)
Urological Symptom Review  Patient is experiencing the following symptoms: Get up at night to urinate Urine stream, stop start  Review of Systems  Gastrointestinal (upper)  : Negative for upper GI symptoms  Gastrointestinal (lower) : Negative for lower GI symptoms  Constitutional : Negative for symptoms  Skin: negative  Eyes: Negative for eye symptoms  Ear/Nose/Throat : Negative for Ear/Nose/Throat symptoms  Hematologic/Lymphatic: Negative for Hematologic/Lymphatic symptoms  Cardiovascular : Negative for cardiovascular symptoms  Respiratory : Negative for respiratory symptoms  Endocrine: Negative for endocrine symptoms  Musculoskeletal: Negative for musculoskeletal symptoms  Neurological: Negative for neurological symptoms  Psychologic: Negative for psychiatric symptoms r

## 2020-01-11 NOTE — Progress Notes (Signed)
H&P  Chief Complaint: Urinary Retention  History of Present Illness:   2.23.2021: Here today for follow-up w/ cysto. In the interval, he has been practicing double voiding and believes this has been helping him immensely with his incomplete emptying. He has also continued on tamsulosin reporting tolerable urinary pattern w/ moderately strong stream -- no dizziness.   Additionally, he is c/o worsening erectile dysfunction. He states that the last time he was able to achieve an erection was 6.25.2020 -- he has only been able to achieve partial, short-lasting erection since. He is interested in medical management of this.   Past Medical History:  Diagnosis Date  . Atrial fibrillation ([place])   . Cardiac dysrhythmia, unspecified   . Compression fracture of L4 lumbar vertebra   . Compression fracture of T12 vertebra (HCC)   . Diabetes mellitus without complication ([place])   . Other and unspecified hyperlipidemia   . Sleep apnea   . SVT (supraventricular tachycardia) ([place])   . Unspecified essential hypertension   . Vitamin D deficiency     Past Surgical History:  Procedure Laterality Date  . ABLATION     x2  . CARDIOVERSION     x2  . HIATAL HERNIA REPAIR     x2  . KYPHOPLASTY    . SKIN CANCER EXCISION      Home Medications:  Allergies as of 01/11/2020      Reactions   Betadine [povidone Iodine] Rash      Medication List       Accurate as of January 11, 2020  4:12 PM. If you have any questions, ask your nurse or doctor.        albuterol (2.5 MG/3ML) 0.083% nebulizer solution Commonly known as: PROVENTIL Inhale 2.5 mg into the lungs every 2 (two) hours as needed.   amoxicillin 250 MG capsule Commonly known as: AMOXIL Take 1 capsule (250 mg total) by mouth 3 (three) times daily.   BIOTIN PO Take 1 capsule by mouth daily.   D-Mannose 500 MG Caps Take by mouth.   diltiazem 240 MG 24 hr capsule Commonly known as: CARDIZEM CD TAKE ONE (1) CAPSULE EACH DAY   Fish Oil  1000 MG Caps Take 1 capsule by mouth daily.   furosemide 40 MG tablet Commonly known as: LASIX Take 40 mg by mouth daily.   metFORMIN 1000 MG tablet Commonly known as: GLUCOPHAGE Take 1,000 mg by mouth 2 (two) times daily with a meal.   multivitamin tablet Take 1 tablet by mouth daily.   tamsulosin 0.4 MG Caps capsule Commonly known as: FLOMAX Take 1 capsule (0.4 mg total) by mouth daily after supper.   warfarin 5 MG tablet Commonly known as: COUMADIN Take 5 mg by mouth daily.       Allergies:  Allergies  Allergen Reactions  . Betadine [Povidone Iodine] Rash    Family History  Problem Relation Age of Onset  . COPD Mother   . Cancer Father   . Heart disease Son   . Heart disease Maternal Grandmother     Social History:  reports that he has quit smoking. He has never used smokeless tobacco. He reports that he does not drink alcohol or use drugs.  ROS: A complete review of systems was performed.  All systems are negative except for pertinent findings as noted.  Physical Exam:  Vital signs in last 24 hours: BP 107/67   Pulse 80   Temp (!) 97.5 F (36.4 C)  Constitutional:  Alert and  oriented, No acute distress Cardiovascular: Regular rate  Respiratory: Normal respiratory effort GI: Abdomen is soft, nontender, nondistended, no abdominal masses. No CVAT.  Genitourinary: Normal male phallus, testes are descended bilaterally and non-tender and without masses, scrotum is normal in appearance without lesions or masses, perineum is normal on inspection. Lymphatic: No lymphadenopathy Neurologic: Grossly intact, no focal deficits Psychiatric: Normal mood and affect  Laboratory Data:  No results for input(s): WBC, HGB, HCT, PLT in the last 72 hours.  No results for input(s): NA, K, CL, GLUCOSE, BUN, CALCIUM, CREATININE in the last 72 hours.  Invalid input(s): CO3   Results for orders placed or performed in visit on 01/11/20 (from the past 24 hour(s))  POCT  urinalysis dipstick     Status: Abnormal   Collection Time: 01/11/20  3:42 PM  Result Value Ref Range   Color, UA yellow    Clarity, UA clear    Glucose, UA Negative Negative   Bilirubin, UA neg    Ketones, UA neg    Spec Grav, UA 1.010 1.010 - 1.025   Blood, UA intact    pH, UA 8.5 (A) 5.0 - 8.0   Protein, UA Negative Negative   Urobilinogen, UA negative (A) 0.2 or 1.0 E.U./dL   Nitrite, UA neg    Leukocytes, UA Trace (A) Negative   Appearance clear    Odor      Cystoscopy Procedure Note:  Indication: LUTS/UTIs  After informed consent and discussion of the procedure and its risks, Jeremy Vargas was positioned and prepped in the standard fashion. Cystoscopy was performed with a flexible cystoscope. The urethra, bladder neck and entire bladder was visualized in a standard fashion. The prostate was obstructive--urethra not elongated. Small floppy ML. The ureteral orifices were visualized in their normal location and orientation. Moderate bladder trabeculations, no stones/tumors.    Impression/Assessment:  Cysto today normal -- his prostate does appear to be obstructive though not severely enlarged. If he is tolerating his urinary sx's well enough on tamsulosin. If these worsen or if he develops any associated dizziness, we will discuss moving forward with evaluating him for surgical intervention.   Plan:  1. Continue on tamsulosin -- he will call if he develops dizziness from this.  2. Return in 6 mo for follow-up w/ PVR. If his urinary sx's worsen, we will discuss surgical options at this visit. Otherwise, we will keep him on medical management.   3. Continue to practice double voiding.

## 2020-01-21 DIAGNOSIS — I10 Essential (primary) hypertension: Secondary | ICD-10-CM | POA: Diagnosis not present

## 2020-01-21 DIAGNOSIS — E11610 Type 2 diabetes mellitus with diabetic neuropathic arthropathy: Secondary | ICD-10-CM | POA: Diagnosis not present

## 2020-01-21 DIAGNOSIS — J449 Chronic obstructive pulmonary disease, unspecified: Secondary | ICD-10-CM | POA: Diagnosis not present

## 2020-02-23 ENCOUNTER — Ambulatory Visit: Payer: Medicare Other | Admitting: Cardiology

## 2020-02-28 DIAGNOSIS — L308 Other specified dermatitis: Secondary | ICD-10-CM | POA: Diagnosis not present

## 2020-02-28 DIAGNOSIS — L820 Inflamed seborrheic keratosis: Secondary | ICD-10-CM | POA: Diagnosis not present

## 2020-02-28 DIAGNOSIS — X32XXXD Exposure to sunlight, subsequent encounter: Secondary | ICD-10-CM | POA: Diagnosis not present

## 2020-02-28 DIAGNOSIS — C44319 Basal cell carcinoma of skin of other parts of face: Secondary | ICD-10-CM | POA: Diagnosis not present

## 2020-02-28 DIAGNOSIS — B078 Other viral warts: Secondary | ICD-10-CM | POA: Diagnosis not present

## 2020-02-28 DIAGNOSIS — L570 Actinic keratosis: Secondary | ICD-10-CM | POA: Diagnosis not present

## 2020-02-29 DIAGNOSIS — E1142 Type 2 diabetes mellitus with diabetic polyneuropathy: Secondary | ICD-10-CM | POA: Diagnosis not present

## 2020-02-29 DIAGNOSIS — M79676 Pain in unspecified toe(s): Secondary | ICD-10-CM | POA: Diagnosis not present

## 2020-02-29 DIAGNOSIS — L84 Corns and callosities: Secondary | ICD-10-CM | POA: Diagnosis not present

## 2020-02-29 DIAGNOSIS — B351 Tinea unguium: Secondary | ICD-10-CM | POA: Diagnosis not present

## 2020-03-09 DIAGNOSIS — J449 Chronic obstructive pulmonary disease, unspecified: Secondary | ICD-10-CM | POA: Diagnosis not present

## 2020-03-09 DIAGNOSIS — E11610 Type 2 diabetes mellitus with diabetic neuropathic arthropathy: Secondary | ICD-10-CM | POA: Diagnosis not present

## 2020-03-09 DIAGNOSIS — I10 Essential (primary) hypertension: Secondary | ICD-10-CM | POA: Diagnosis not present

## 2020-03-15 DIAGNOSIS — L570 Actinic keratosis: Secondary | ICD-10-CM | POA: Diagnosis not present

## 2020-03-15 DIAGNOSIS — I482 Chronic atrial fibrillation, unspecified: Secondary | ICD-10-CM | POA: Diagnosis not present

## 2020-03-15 DIAGNOSIS — I700 Atherosclerosis of aorta: Secondary | ICD-10-CM | POA: Diagnosis not present

## 2020-03-15 DIAGNOSIS — G9009 Other idiopathic peripheral autonomic neuropathy: Secondary | ICD-10-CM | POA: Diagnosis not present

## 2020-03-15 DIAGNOSIS — J449 Chronic obstructive pulmonary disease, unspecified: Secondary | ICD-10-CM | POA: Diagnosis not present

## 2020-03-15 DIAGNOSIS — I25118 Atherosclerotic heart disease of native coronary artery with other forms of angina pectoris: Secondary | ICD-10-CM | POA: Diagnosis not present

## 2020-03-15 DIAGNOSIS — E11610 Type 2 diabetes mellitus with diabetic neuropathic arthropathy: Secondary | ICD-10-CM | POA: Diagnosis not present

## 2020-03-15 DIAGNOSIS — I10 Essential (primary) hypertension: Secondary | ICD-10-CM | POA: Diagnosis not present

## 2020-03-15 DIAGNOSIS — J3089 Other allergic rhinitis: Secondary | ICD-10-CM | POA: Diagnosis not present

## 2020-03-21 NOTE — Progress Notes (Signed)
Cardiology Office Note   Date:  03/22/2020   ID:  Jeremy Vargas, DOB 1930-02-27, MRN [id number]  PCP:  Neale Burly, MD  Cardiologist:   Minus Breeding, MD   Chief Complaint  Patient presents with  . Atrial Fibrillation      History of Present Illness: Jeremy Vargas is a 86 y.o. male who presents for evaluation of atrial fibrillation. He has had a history of SVT ablated twice around 2010 when he lived in the [place]. He had atrial fibrillation following this and had cardioversion twice.  He is now in permanent atrial fib.  Since I last saw him he has had no cardiovascular complaints.  He is limited by back pain which inhibits his walking.  He gets around with a cane sometimes a walker and occasionally a wheelchair.  He is battling some basal cell cancer.  He does not feel his atrial fibrillation.  He does have any presyncope or syncope.  He tolerates anticoagulation.  He has had no bleeding issues.  He has had no chest pressure, neck or arm discomfort.  He had no shortness of breath, PND or orthopnea.  He had no weight gain or edema.   Past Medical History:  Diagnosis Date  . Atrial fibrillation ([place])   . Cardiac dysrhythmia, unspecified   . Compression fracture of L4 lumbar vertebra   . Compression fracture of T12 vertebra (HCC)   . Diabetes mellitus without complication ([place])   . Other and unspecified hyperlipidemia   . Sleep apnea   . SVT (supraventricular tachycardia) ([place])   . Unspecified essential hypertension   . Vitamin D deficiency     Past Surgical History:  Procedure Laterality Date  . ABLATION     x2  . CARDIOVERSION     x2  . HIATAL HERNIA REPAIR     x2  . KYPHOPLASTY    . SKIN CANCER EXCISION       Current Outpatient Medications  Medication Sig Dispense Refill  . BIOTIN PO Take 1 capsule by mouth daily.    Marland Kitchen diltiazem (CARDIZEM CD) 240 MG 24 hr capsule TAKE ONE (1) CAPSULE EACH DAY 90 capsule 1  . furosemide (LASIX) 40 MG tablet Take 40 mg by  mouth daily.    . metFORMIN (GLUCOPHAGE) 1000 MG tablet Take 1,000 mg by mouth 2 (two) times daily with a meal.     . Multiple Vitamin (MULTIVITAMIN) tablet Take 1 tablet by mouth daily.    . Omega-3 Fatty Acids (FISH OIL) 1000 MG CAPS Take 1 capsule by mouth daily.    . tamsulosin (FLOMAX) 0.4 MG CAPS capsule Take 1 capsule (0.4 mg total) by mouth daily after supper. 90 capsule 11  . warfarin (COUMADIN) 5 MG tablet Take 5 mg by mouth daily.     No current facility-administered medications for this visit.    Allergies:   Betadine [povidone iodine]    ROS:  Please see the history of present illness.   Otherwise, review of systems are positive for none.   All other systems are reviewed and negative.    PHYSICAL EXAM: VS:  BP 110/60   Pulse 72   Ht 5\' 10"  (1.778 m)   Wt 186 lb (84.4 kg)   BMI 26.69 kg/m  , BMI Body mass index is 26.69 kg/m. GENERAL:  Well appearing NECK:  No jugular venous distention, waveform within normal limits, carotid upstroke brisk and symmetric, no bruits, no thyromegaly LUNGS:  Clear to auscultation bilaterally CHEST:  Unremarkable HEART:  PMI not displaced or sustained,S1 and S2 within normal limits,  no clicks, no rubs, no S3,  murmurs, Irregular  ABD:  Flat, positive bowel sounds normal in frequency in pitch, no bruits, no rebound, no guarding, no midline pulsatile mass, no hepatomegaly, no splenomegaly EXT:  2 plus pulses throughout, no edema, no cyanosis no clubbing    EKG:  EKG is ordered today. The ekg ordered today demonstrates atrial fibrillation, rate 72, left axis deviation, poor anterior R wave progression, no acute ST-T wave changes.   Recent Labs: 10/22/2019: ALT 20; BUN 40; Creatinine, Ser 1.66; Hemoglobin 14.6; Platelets 231; Potassium 3.8; Sodium 135    Lipid Panel    Component Value Date/Time   CHOL 185 06/09/2013 0959   TRIG 171 (H) 06/09/2013 0959   HDL 39 (L) 06/09/2013 0959   CHOLHDL 4.7 06/09/2013 0959   VLDL 34 06/09/2013  0959   LDLCALC 112 (H) 06/09/2013 0959      Wt Readings from Last 3 Encounters:  03/22/20 186 lb (84.4 kg)  12/14/19 185 lb (83.9 kg)  12/16/18 191 lb (86.6 kg)      Other studies Reviewed: Additional studies/ records that were reviewed today include: None. Review of the above records demonstrates:  Please see elsewhere in the note.     ASSESSMENT AND PLAN:  Atrial fibrillation -     Chronic atrial fib.   [name] has a CHA2DS2 - VASc score of 4 with a risk of stroke of 4% .  No change in therapy is indicated.  SVT -   he has had no symptomatic recurrence of this.  No change in therapy.  HTN -  blood pressures well controlled.  We will continue with meds as listed.  Covid education: He has not had his Covid vaccine.  We talked about this and I answered questions.  Current medicines are reviewed at length with the patient today.  The patient does not have concerns regarding medicines.  The following changes have been made:  no change  Labs/ tests ordered today include: None  Orders Placed This Encounter  Procedures  . EKG 12-Lead     Disposition:   FU with me in one year.     Signed, Minus Breeding, MD  03/22/2020 3:26 PM    [facility]

## 2020-03-22 ENCOUNTER — Ambulatory Visit (INDEPENDENT_AMBULATORY_CARE_PROVIDER_SITE_OTHER): Payer: Medicare Other | Admitting: Cardiology

## 2020-03-22 ENCOUNTER — Encounter: Payer: Self-pay | Admitting: Cardiology

## 2020-03-22 ENCOUNTER — Other Ambulatory Visit: Payer: Self-pay

## 2020-03-22 VITALS — BP 110/60 | HR 72 | Ht 70.0 in | Wt 186.0 lb

## 2020-03-22 DIAGNOSIS — I471 Supraventricular tachycardia: Secondary | ICD-10-CM | POA: Diagnosis not present

## 2020-03-22 DIAGNOSIS — I10 Essential (primary) hypertension: Secondary | ICD-10-CM

## 2020-03-22 DIAGNOSIS — Z7189 Other specified counseling: Secondary | ICD-10-CM | POA: Diagnosis not present

## 2020-03-22 DIAGNOSIS — I4821 Permanent atrial fibrillation: Secondary | ICD-10-CM

## 2020-03-22 NOTE — Patient Instructions (Signed)
Medication Instructions:  The current medical regimen is effective;  continue present plan and medications.  *If you need a refill on your cardiac medications before your next appointment, please call your pharmacy*  Follow-Up: At [facility], you and your health needs are our priority.  As part of our continuing mission to provide you with exceptional heart care, we have created designated Provider Care Teams.  These Care Teams include your primary Cardiologist (physician) and Advanced Practice Providers (APPs -  Physician Assistants and Nurse Practitioners) who all work together to provide you with the care you need, when you need it.  We recommend signing up for the patient portal called "MyChart".  Sign up information is provided on this After Visit Summary.  MyChart is used to connect with patients for Virtual Visits (Telemedicine).  Patients are able to view lab/test results, encounter notes, upcoming appointments, etc.  Non-urgent messages can be sent to your provider as well.   To learn more about what you can do with MyChart, go to [email or web address].    Your next appointment:   12 month(s)  The format for your next appointment:   In Person  Provider:   James Hochrein, MD   Thank you for choosing [facility]!!     

## 2020-03-27 ENCOUNTER — Ambulatory Visit (INDEPENDENT_AMBULATORY_CARE_PROVIDER_SITE_OTHER): Payer: Medicare Other | Admitting: Internal Medicine

## 2020-03-28 DIAGNOSIS — C44319 Basal cell carcinoma of skin of other parts of face: Secondary | ICD-10-CM | POA: Diagnosis not present

## 2020-03-28 DIAGNOSIS — D485 Neoplasm of uncertain behavior of skin: Secondary | ICD-10-CM | POA: Diagnosis not present

## 2020-04-04 DIAGNOSIS — I10 Essential (primary) hypertension: Secondary | ICD-10-CM | POA: Diagnosis not present

## 2020-04-04 DIAGNOSIS — J449 Chronic obstructive pulmonary disease, unspecified: Secondary | ICD-10-CM | POA: Diagnosis not present

## 2020-04-04 DIAGNOSIS — E11610 Type 2 diabetes mellitus with diabetic neuropathic arthropathy: Secondary | ICD-10-CM | POA: Diagnosis not present

## 2020-04-10 DIAGNOSIS — L820 Inflamed seborrheic keratosis: Secondary | ICD-10-CM | POA: Diagnosis not present

## 2020-04-10 DIAGNOSIS — L570 Actinic keratosis: Secondary | ICD-10-CM | POA: Diagnosis not present

## 2020-04-10 DIAGNOSIS — X32XXXD Exposure to sunlight, subsequent encounter: Secondary | ICD-10-CM | POA: Diagnosis not present

## 2020-04-10 DIAGNOSIS — L821 Other seborrheic keratosis: Secondary | ICD-10-CM | POA: Diagnosis not present

## 2020-05-02 DIAGNOSIS — E11610 Type 2 diabetes mellitus with diabetic neuropathic arthropathy: Secondary | ICD-10-CM | POA: Diagnosis not present

## 2020-05-02 DIAGNOSIS — J449 Chronic obstructive pulmonary disease, unspecified: Secondary | ICD-10-CM | POA: Diagnosis not present

## 2020-05-02 DIAGNOSIS — I10 Essential (primary) hypertension: Secondary | ICD-10-CM | POA: Diagnosis not present

## 2020-05-05 ENCOUNTER — Other Ambulatory Visit: Payer: Self-pay | Admitting: Cardiology

## 2020-06-09 DIAGNOSIS — I10 Essential (primary) hypertension: Secondary | ICD-10-CM | POA: Diagnosis not present

## 2020-06-09 DIAGNOSIS — J449 Chronic obstructive pulmonary disease, unspecified: Secondary | ICD-10-CM | POA: Diagnosis not present

## 2020-06-09 DIAGNOSIS — E11610 Type 2 diabetes mellitus with diabetic neuropathic arthropathy: Secondary | ICD-10-CM | POA: Diagnosis not present

## 2020-06-14 DIAGNOSIS — I482 Chronic atrial fibrillation, unspecified: Secondary | ICD-10-CM | POA: Diagnosis not present

## 2020-06-14 DIAGNOSIS — I25118 Atherosclerotic heart disease of native coronary artery with other forms of angina pectoris: Secondary | ICD-10-CM | POA: Diagnosis not present

## 2020-06-14 DIAGNOSIS — J3089 Other allergic rhinitis: Secondary | ICD-10-CM | POA: Diagnosis not present

## 2020-06-14 DIAGNOSIS — L720 Epidermal cyst: Secondary | ICD-10-CM | POA: Diagnosis not present

## 2020-06-14 DIAGNOSIS — E11610 Type 2 diabetes mellitus with diabetic neuropathic arthropathy: Secondary | ICD-10-CM | POA: Diagnosis not present

## 2020-06-14 DIAGNOSIS — I10 Essential (primary) hypertension: Secondary | ICD-10-CM | POA: Diagnosis not present

## 2020-06-14 DIAGNOSIS — J449 Chronic obstructive pulmonary disease, unspecified: Secondary | ICD-10-CM | POA: Diagnosis not present

## 2020-06-14 DIAGNOSIS — G9009 Other idiopathic peripheral autonomic neuropathy: Secondary | ICD-10-CM | POA: Diagnosis not present

## 2020-06-14 DIAGNOSIS — I700 Atherosclerosis of aorta: Secondary | ICD-10-CM | POA: Diagnosis not present

## 2020-06-16 DIAGNOSIS — I482 Chronic atrial fibrillation, unspecified: Secondary | ICD-10-CM | POA: Diagnosis not present

## 2020-07-07 DIAGNOSIS — I10 Essential (primary) hypertension: Secondary | ICD-10-CM | POA: Diagnosis not present

## 2020-07-07 DIAGNOSIS — L570 Actinic keratosis: Secondary | ICD-10-CM | POA: Diagnosis not present

## 2020-07-07 DIAGNOSIS — I700 Atherosclerosis of aorta: Secondary | ICD-10-CM | POA: Diagnosis not present

## 2020-07-07 DIAGNOSIS — G9009 Other idiopathic peripheral autonomic neuropathy: Secondary | ICD-10-CM | POA: Diagnosis not present

## 2020-07-07 DIAGNOSIS — E11610 Type 2 diabetes mellitus with diabetic neuropathic arthropathy: Secondary | ICD-10-CM | POA: Diagnosis not present

## 2020-07-07 DIAGNOSIS — L720 Epidermal cyst: Secondary | ICD-10-CM | POA: Diagnosis not present

## 2020-07-07 DIAGNOSIS — I482 Chronic atrial fibrillation, unspecified: Secondary | ICD-10-CM | POA: Diagnosis not present

## 2020-07-07 DIAGNOSIS — I25118 Atherosclerotic heart disease of native coronary artery with other forms of angina pectoris: Secondary | ICD-10-CM | POA: Diagnosis not present

## 2020-07-07 DIAGNOSIS — J3089 Other allergic rhinitis: Secondary | ICD-10-CM | POA: Diagnosis not present

## 2020-07-07 DIAGNOSIS — J449 Chronic obstructive pulmonary disease, unspecified: Secondary | ICD-10-CM | POA: Diagnosis not present

## 2020-07-11 ENCOUNTER — Ambulatory Visit: Payer: Medicare Other | Admitting: Urology

## 2020-07-11 NOTE — Progress Notes (Incomplete)
H&P  Chief Complaint: ***  History of Present Illness:   8.24.2021:  (below copied from [place] records):  2.23.2021: Here today for follow-up w/ cysto. In the interval, he has been practicing double voiding and believes this has been helping him immensely with his incomplete emptying. He has also continued on tamsulosin reporting tolerable urinary pattern w/ moderately strong stream -- no dizziness.   Additionally, he is c/o worsening erectile dysfunction. He states that the last time he was able to achieve an erection was 6.25.2020 -- he has only been able to achieve partial, short-lasting erection since. He is interested in medical management of this.   Past Medical History:  Diagnosis Date  . Atrial fibrillation ([place])   . Cardiac dysrhythmia, unspecified   . Compression fracture of L4 lumbar vertebra   . Compression fracture of T12 vertebra (HCC)   . Diabetes mellitus without complication ([place])   . Other and unspecified hyperlipidemia   . Sleep apnea   . SVT (supraventricular tachycardia) ([place])   . Unspecified essential hypertension   . Vitamin D deficiency     Past Surgical History:  Procedure Laterality Date  . ABLATION     x2  . CARDIOVERSION     x2  . HIATAL HERNIA REPAIR     x2  . KYPHOPLASTY    . SKIN CANCER EXCISION      Home Medications:  Allergies as of 07/11/2020      Reactions   Betadine [povidone Iodine] Rash      Medication List       Accurate as of July 11, 2020  2:03 PM. If you have any questions, ask your nurse or doctor.        BIOTIN PO Take 1 capsule by mouth daily.   diltiazem 240 MG 24 hr capsule Commonly known as: CARDIZEM CD TAKE ONE (1) CAPSULE EACH DAY   Fish Oil 1000 MG Caps Take 1 capsule by mouth daily.   furosemide 40 MG tablet Commonly known as: LASIX Take 40 mg by mouth daily.   metFORMIN 1000 MG tablet Commonly known as: GLUCOPHAGE Take 1,000 mg by mouth 2 (two) times daily with a meal.   multivitamin  tablet Take 1 tablet by mouth daily.   tamsulosin 0.4 MG Caps capsule Commonly known as: FLOMAX Take 1 capsule (0.4 mg total) by mouth daily after supper.   warfarin 5 MG tablet Commonly known as: COUMADIN Take 5 mg by mouth daily.       Allergies:  Allergies  Allergen Reactions  . Betadine [Povidone Iodine] Rash    Family History  Problem Relation Age of Onset  . COPD Mother   . Cancer Father   . Heart disease Son   . Heart disease Maternal Grandmother     Social History:  reports that he has quit smoking. He has never used smokeless tobacco. He reports that he does not drink alcohol and does not use drugs.  ROS: A complete review of systems was performed.  All systems are negative except for pertinent findings as noted.  Physical Exam:  Vital signs in last 24 hours: There were no vitals taken for this visit. Constitutional:  Alert and oriented, No acute distress Cardiovascular: Regular rate  Respiratory: Normal respiratory effort GI: Abdomen is soft, nontender, nondistended, no abdominal masses. No CVAT.  Genitourinary: Normal male phallus, testes are descended bilaterally and non-tender and without masses, scrotum is normal in appearance without lesions or masses, perineum is normal on inspection. Lymphatic: No  lymphadenopathy Neurologic: Grossly intact, no focal deficits Psychiatric: Normal mood and affect  Laboratory Data:  No results for input(s): WBC, HGB, HCT, PLT in the last 72 hours.  No results for input(s): NA, K, CL, GLUCOSE, BUN, CALCIUM, CREATININE in the last 72 hours.  Invalid input(s): CO3   No results found for this or any previous visit (from the past 24 hour(s)). No results found for this or any previous visit (from the past 240 hour(s)).  Renal Function: No results for input(s): CREATININE in the last 168 hours. CrCl cannot be calculated (Patient's most recent lab result is older than the maximum 21 days allowed.).  Radiologic  Imaging: No results found.  Impression/Assessment:  ***  Plan:  ***

## 2020-08-01 DIAGNOSIS — I10 Essential (primary) hypertension: Secondary | ICD-10-CM | POA: Diagnosis not present

## 2020-08-01 DIAGNOSIS — E11610 Type 2 diabetes mellitus with diabetic neuropathic arthropathy: Secondary | ICD-10-CM | POA: Diagnosis not present

## 2020-08-01 DIAGNOSIS — J449 Chronic obstructive pulmonary disease, unspecified: Secondary | ICD-10-CM | POA: Diagnosis not present

## 2020-08-08 DIAGNOSIS — E1142 Type 2 diabetes mellitus with diabetic polyneuropathy: Secondary | ICD-10-CM | POA: Diagnosis not present

## 2020-08-08 DIAGNOSIS — L84 Corns and callosities: Secondary | ICD-10-CM | POA: Diagnosis not present

## 2020-08-08 DIAGNOSIS — B351 Tinea unguium: Secondary | ICD-10-CM | POA: Diagnosis not present

## 2020-08-08 DIAGNOSIS — M79676 Pain in unspecified toe(s): Secondary | ICD-10-CM | POA: Diagnosis not present

## 2020-08-18 DIAGNOSIS — I482 Chronic atrial fibrillation, unspecified: Secondary | ICD-10-CM | POA: Diagnosis not present

## 2020-08-28 DIAGNOSIS — J449 Chronic obstructive pulmonary disease, unspecified: Secondary | ICD-10-CM | POA: Diagnosis not present

## 2020-08-28 DIAGNOSIS — I10 Essential (primary) hypertension: Secondary | ICD-10-CM | POA: Diagnosis not present

## 2020-08-28 DIAGNOSIS — E11610 Type 2 diabetes mellitus with diabetic neuropathic arthropathy: Secondary | ICD-10-CM | POA: Diagnosis not present

## 2020-08-29 DIAGNOSIS — J439 Emphysema, unspecified: Secondary | ICD-10-CM | POA: Diagnosis not present

## 2020-08-29 DIAGNOSIS — J479 Bronchiectasis, uncomplicated: Secondary | ICD-10-CM | POA: Diagnosis not present

## 2020-08-29 DIAGNOSIS — I700 Atherosclerosis of aorta: Secondary | ICD-10-CM | POA: Diagnosis not present

## 2020-08-29 DIAGNOSIS — I2510 Atherosclerotic heart disease of native coronary artery without angina pectoris: Secondary | ICD-10-CM | POA: Diagnosis not present

## 2020-08-29 DIAGNOSIS — R911 Solitary pulmonary nodule: Secondary | ICD-10-CM | POA: Diagnosis not present

## 2020-09-14 DIAGNOSIS — G9009 Other idiopathic peripheral autonomic neuropathy: Secondary | ICD-10-CM | POA: Diagnosis not present

## 2020-09-14 DIAGNOSIS — I700 Atherosclerosis of aorta: Secondary | ICD-10-CM | POA: Diagnosis not present

## 2020-09-14 DIAGNOSIS — I25118 Atherosclerotic heart disease of native coronary artery with other forms of angina pectoris: Secondary | ICD-10-CM | POA: Diagnosis not present

## 2020-09-14 DIAGNOSIS — E11610 Type 2 diabetes mellitus with diabetic neuropathic arthropathy: Secondary | ICD-10-CM | POA: Diagnosis not present

## 2020-09-14 DIAGNOSIS — I482 Chronic atrial fibrillation, unspecified: Secondary | ICD-10-CM | POA: Diagnosis not present

## 2020-09-14 DIAGNOSIS — J449 Chronic obstructive pulmonary disease, unspecified: Secondary | ICD-10-CM | POA: Diagnosis not present

## 2020-09-14 DIAGNOSIS — J3089 Other allergic rhinitis: Secondary | ICD-10-CM | POA: Diagnosis not present

## 2020-09-14 DIAGNOSIS — L570 Actinic keratosis: Secondary | ICD-10-CM | POA: Diagnosis not present

## 2020-09-14 DIAGNOSIS — I10 Essential (primary) hypertension: Secondary | ICD-10-CM | POA: Diagnosis not present

## 2020-10-17 DIAGNOSIS — B351 Tinea unguium: Secondary | ICD-10-CM | POA: Diagnosis not present

## 2020-10-17 DIAGNOSIS — M79676 Pain in unspecified toe(s): Secondary | ICD-10-CM | POA: Diagnosis not present

## 2020-10-17 DIAGNOSIS — L84 Corns and callosities: Secondary | ICD-10-CM | POA: Diagnosis not present

## 2020-10-17 DIAGNOSIS — E1142 Type 2 diabetes mellitus with diabetic polyneuropathy: Secondary | ICD-10-CM | POA: Diagnosis not present

## 2020-11-03 DIAGNOSIS — I482 Chronic atrial fibrillation, unspecified: Secondary | ICD-10-CM | POA: Diagnosis not present

## 2020-11-13 DIAGNOSIS — I10 Essential (primary) hypertension: Secondary | ICD-10-CM | POA: Diagnosis not present

## 2020-11-13 DIAGNOSIS — J449 Chronic obstructive pulmonary disease, unspecified: Secondary | ICD-10-CM | POA: Diagnosis not present

## 2020-11-13 DIAGNOSIS — E11610 Type 2 diabetes mellitus with diabetic neuropathic arthropathy: Secondary | ICD-10-CM | POA: Diagnosis not present

## 2020-12-18 DIAGNOSIS — I700 Atherosclerosis of aorta: Secondary | ICD-10-CM | POA: Diagnosis not present

## 2020-12-18 DIAGNOSIS — J3089 Other allergic rhinitis: Secondary | ICD-10-CM | POA: Diagnosis not present

## 2020-12-18 DIAGNOSIS — G9009 Other idiopathic peripheral autonomic neuropathy: Secondary | ICD-10-CM | POA: Diagnosis not present

## 2020-12-18 DIAGNOSIS — I482 Chronic atrial fibrillation, unspecified: Secondary | ICD-10-CM | POA: Diagnosis not present

## 2020-12-18 DIAGNOSIS — I10 Essential (primary) hypertension: Secondary | ICD-10-CM | POA: Diagnosis not present

## 2020-12-18 DIAGNOSIS — E11610 Type 2 diabetes mellitus with diabetic neuropathic arthropathy: Secondary | ICD-10-CM | POA: Diagnosis not present

## 2020-12-18 DIAGNOSIS — L570 Actinic keratosis: Secondary | ICD-10-CM | POA: Diagnosis not present

## 2020-12-18 DIAGNOSIS — J449 Chronic obstructive pulmonary disease, unspecified: Secondary | ICD-10-CM | POA: Diagnosis not present

## 2020-12-18 DIAGNOSIS — I25118 Atherosclerotic heart disease of native coronary artery with other forms of angina pectoris: Secondary | ICD-10-CM | POA: Diagnosis not present

## 2020-12-19 DIAGNOSIS — B351 Tinea unguium: Secondary | ICD-10-CM | POA: Diagnosis not present

## 2020-12-19 DIAGNOSIS — E1142 Type 2 diabetes mellitus with diabetic polyneuropathy: Secondary | ICD-10-CM | POA: Diagnosis not present

## 2020-12-19 DIAGNOSIS — M79676 Pain in unspecified toe(s): Secondary | ICD-10-CM | POA: Diagnosis not present

## 2020-12-19 DIAGNOSIS — L84 Corns and callosities: Secondary | ICD-10-CM | POA: Diagnosis not present

## 2021-01-19 DIAGNOSIS — E11610 Type 2 diabetes mellitus with diabetic neuropathic arthropathy: Secondary | ICD-10-CM | POA: Diagnosis not present

## 2021-01-19 DIAGNOSIS — I482 Chronic atrial fibrillation, unspecified: Secondary | ICD-10-CM | POA: Diagnosis not present

## 2021-02-05 ENCOUNTER — Other Ambulatory Visit: Payer: Self-pay | Admitting: Cardiology

## 2021-02-27 DIAGNOSIS — M79676 Pain in unspecified toe(s): Secondary | ICD-10-CM | POA: Diagnosis not present

## 2021-02-27 DIAGNOSIS — B351 Tinea unguium: Secondary | ICD-10-CM | POA: Diagnosis not present

## 2021-02-27 DIAGNOSIS — L84 Corns and callosities: Secondary | ICD-10-CM | POA: Diagnosis not present

## 2021-02-27 DIAGNOSIS — E1142 Type 2 diabetes mellitus with diabetic polyneuropathy: Secondary | ICD-10-CM | POA: Diagnosis not present

## 2021-03-15 DIAGNOSIS — X32XXXD Exposure to sunlight, subsequent encounter: Secondary | ICD-10-CM | POA: Diagnosis not present

## 2021-03-15 DIAGNOSIS — C44319 Basal cell carcinoma of skin of other parts of face: Secondary | ICD-10-CM | POA: Diagnosis not present

## 2021-03-15 DIAGNOSIS — Z85828 Personal history of other malignant neoplasm of skin: Secondary | ICD-10-CM | POA: Diagnosis not present

## 2021-03-15 DIAGNOSIS — L570 Actinic keratosis: Secondary | ICD-10-CM | POA: Diagnosis not present

## 2021-03-15 DIAGNOSIS — Z08 Encounter for follow-up examination after completed treatment for malignant neoplasm: Secondary | ICD-10-CM | POA: Diagnosis not present

## 2021-03-21 DIAGNOSIS — J3089 Other allergic rhinitis: Secondary | ICD-10-CM | POA: Diagnosis not present

## 2021-03-21 DIAGNOSIS — J449 Chronic obstructive pulmonary disease, unspecified: Secondary | ICD-10-CM | POA: Diagnosis not present

## 2021-03-21 DIAGNOSIS — G9009 Other idiopathic peripheral autonomic neuropathy: Secondary | ICD-10-CM | POA: Diagnosis not present

## 2021-03-21 DIAGNOSIS — E11610 Type 2 diabetes mellitus with diabetic neuropathic arthropathy: Secondary | ICD-10-CM | POA: Diagnosis not present

## 2021-03-21 DIAGNOSIS — I10 Essential (primary) hypertension: Secondary | ICD-10-CM | POA: Diagnosis not present

## 2021-03-21 DIAGNOSIS — I482 Chronic atrial fibrillation, unspecified: Secondary | ICD-10-CM | POA: Diagnosis not present

## 2021-03-21 DIAGNOSIS — I25118 Atherosclerotic heart disease of native coronary artery with other forms of angina pectoris: Secondary | ICD-10-CM | POA: Diagnosis not present

## 2021-03-21 DIAGNOSIS — I700 Atherosclerosis of aorta: Secondary | ICD-10-CM | POA: Diagnosis not present

## 2021-03-23 DIAGNOSIS — E11610 Type 2 diabetes mellitus with diabetic neuropathic arthropathy: Secondary | ICD-10-CM | POA: Diagnosis not present

## 2021-03-23 DIAGNOSIS — G9009 Other idiopathic peripheral autonomic neuropathy: Secondary | ICD-10-CM | POA: Diagnosis not present

## 2021-03-23 DIAGNOSIS — J449 Chronic obstructive pulmonary disease, unspecified: Secondary | ICD-10-CM | POA: Diagnosis not present

## 2021-03-23 DIAGNOSIS — I482 Chronic atrial fibrillation, unspecified: Secondary | ICD-10-CM | POA: Diagnosis not present

## 2021-03-23 DIAGNOSIS — J3089 Other allergic rhinitis: Secondary | ICD-10-CM | POA: Diagnosis not present

## 2021-03-23 DIAGNOSIS — I10 Essential (primary) hypertension: Secondary | ICD-10-CM | POA: Diagnosis not present

## 2021-03-23 DIAGNOSIS — I700 Atherosclerosis of aorta: Secondary | ICD-10-CM | POA: Diagnosis not present

## 2021-03-23 DIAGNOSIS — I25118 Atherosclerotic heart disease of native coronary artery with other forms of angina pectoris: Secondary | ICD-10-CM | POA: Diagnosis not present

## 2021-03-27 DIAGNOSIS — I2510 Atherosclerotic heart disease of native coronary artery without angina pectoris: Secondary | ICD-10-CM | POA: Diagnosis not present

## 2021-03-27 DIAGNOSIS — R911 Solitary pulmonary nodule: Secondary | ICD-10-CM | POA: Diagnosis not present

## 2021-03-27 DIAGNOSIS — J984 Other disorders of lung: Secondary | ICD-10-CM | POA: Diagnosis not present

## 2021-03-27 DIAGNOSIS — J479 Bronchiectasis, uncomplicated: Secondary | ICD-10-CM | POA: Diagnosis not present

## 2021-03-27 DIAGNOSIS — J438 Other emphysema: Secondary | ICD-10-CM | POA: Diagnosis not present

## 2021-04-10 DIAGNOSIS — I10 Essential (primary) hypertension: Secondary | ICD-10-CM | POA: Insufficient documentation

## 2021-04-10 NOTE — Progress Notes (Signed)
Cardiology Office Note   Date:  04/11/2021   ID:  Jeremy Vargas, DOB [birth year], MRN [id number]  PCP:  Neale Burly, MD  Cardiologist:   Minus Breeding, MD   Chief Complaint  Patient presents with  . Atrial Fibrillation      History of Present Illness: Jeremy Vargas is a 87 y.o. male who presents for evaluation of atrial fibrillation. He has had a history of SVT ablated twice around 2010 when he lived in the [place]. He had atrial fibrillation following this and had cardioversion twice.  He is now in permanent atrial fib.  Since I last saw him he has had no new cardiovascular complaints.  He continues to get basal cell cancers removed. The patient denies any new symptoms such as chest discomfort, neck or arm discomfort. There has been no new shortness of breath, PND or orthopnea. There have been no reported palpitations, presyncope or syncope.  He has been back problems slowly with a walker.  Past Medical History:  Diagnosis Date  . Atrial fibrillation ([place])   . Cardiac dysrhythmia, unspecified   . Compression fracture of L4 lumbar vertebra   . Compression fracture of T12 vertebra (HCC)   . Diabetes mellitus without complication ([place])   . Other and unspecified hyperlipidemia   . Sleep apnea   . SVT (supraventricular tachycardia) ([place])   . Unspecified essential hypertension   . Vitamin D deficiency     Past Surgical History:  Procedure Laterality Date  . ABLATION     x2  . CARDIOVERSION     x2  . HIATAL HERNIA REPAIR     x2  . KYPHOPLASTY    . SKIN CANCER EXCISION       Current Outpatient Medications  Medication Sig Dispense Refill  . BIOTIN PO Take 1 capsule by mouth daily.    Marland Kitchen diltiazem (CARDIZEM CD) 240 MG 24 hr capsule TAKE ONE (1) CAPSULE EACH DAY 90 capsule 0  . furosemide (LASIX) 40 MG tablet Take 40 mg by mouth daily.    . metFORMIN (GLUCOPHAGE) 1000 MG tablet Take 1,000 mg by mouth 2 (two) times daily with a meal.     . Multiple Vitamin  (MULTIVITAMIN) tablet Take 1 tablet by mouth daily.    . Omega-3 Fatty Acids (FISH OIL) 1000 MG CAPS Take 1 capsule by mouth daily.    . tamsulosin (FLOMAX) 0.4 MG CAPS capsule Take 1 capsule (0.4 mg total) by mouth daily after supper. 90 capsule 11  . warfarin (COUMADIN) 5 MG tablet Take 5 mg by mouth daily.     No current facility-administered medications for this visit.    Allergies:   Betadine [povidone iodine]    ROS:  Please see the history of present illness.   Otherwise, review of systems are positive for none.   All other systems are reviewed and negative.    PHYSICAL EXAM: VS:  BP 117/70   Pulse 88   Ht 5\' 9"  (1.753 m)   Wt 177 lb (80.3 kg)   BMI 26.14 kg/m  , BMI Body mass index is 26.14 kg/m. GENERAL:  Well appearing NECK:  No jugular venous distention, waveform within normal limits, carotid upstroke brisk and symmetric, no bruits, no thyromegaly LUNGS:  Clear to auscultation bilaterally CHEST:  Unremarkable HEART:  PMI not displaced or sustained,S1 and S2 within normal limits, no S3,  no clicks, no rubs, no murmurs, irregular ABD:  Flat, positive bowel sounds normal in frequency in pitch, no  bruits, no rebound, no guarding, no midline pulsatile mass, no hepatomegaly, no splenomegaly EXT:  2 plus pulses throughout, no edema, no cyanosis no clubbing   EKG:  EKG is  ordered today. The ekg ordered today demonstrates atrial fibrillation, rate 88, left axis deviation, poor anterior R wave progression, no acute ST-T wave changes.  Premature ventricular contractions   Recent Labs: No results found for requested labs within last 8760 hours.    Lipid Panel    Component Value Date/Time   CHOL 185 06/09/2013 0959   TRIG 171 (H) 06/09/2013 0959   HDL 39 (L) 06/09/2013 0959   CHOLHDL 4.7 06/09/2013 0959   VLDL 34 06/09/2013 0959   LDLCALC 112 (H) 06/09/2013 0959      Wt Readings from Last 3 Encounters:  04/11/21 177 lb (80.3 kg)  03/22/20 186 lb (84.4 kg)   12/14/19 185 lb (83.9 kg)      Other studies Reviewed: Additional studies/ records that were reviewed today include: CT chest . Review of the above records demonstrates:  Please see elsewhere in the note.     ASSESSMENT AND PLAN:  Atrial fibrillation -     Chronic atrial fib.   [name] has a CHA2DS2 - VASc score of 4 with a risk of stroke of 4% .    No change in therapy.    AORTIC CALCIFICATION/CORONARY CALCIFICATION:   This was noted recently on the CT he had with follow-up pulmonary nodules.  I reviewed this result for this visit.  He has no symptoms related to this.  He can continue with primary risk reduction.  HTN -  blood pressure is well controlled.  No change therapy.     Current medicines are reviewed at length with the patient today.  The patient does not have concerns regarding medicines.  The following changes have been made:  None  Labs/ tests ordered today include:  None  Orders Placed This Encounter  Procedures  . EKG 12-Lead     Disposition:   FU with me in one year.     Signed, Minus Breeding, MD  04/11/2021 2:48 PM    [facility]

## 2021-04-11 ENCOUNTER — Encounter: Payer: Self-pay | Admitting: Cardiology

## 2021-04-11 ENCOUNTER — Ambulatory Visit (INDEPENDENT_AMBULATORY_CARE_PROVIDER_SITE_OTHER): Payer: Medicare Other | Admitting: Cardiology

## 2021-04-11 ENCOUNTER — Other Ambulatory Visit: Payer: Self-pay

## 2021-04-11 VITALS — BP 117/70 | HR 88 | Ht 69.0 in | Wt 177.0 lb

## 2021-04-11 DIAGNOSIS — I10 Essential (primary) hypertension: Secondary | ICD-10-CM | POA: Diagnosis not present

## 2021-04-11 DIAGNOSIS — I4821 Permanent atrial fibrillation: Secondary | ICD-10-CM | POA: Diagnosis not present

## 2021-04-11 NOTE — Patient Instructions (Signed)
Medication Instructions:  The current medical regimen is effective;  continue present plan and medications.  *If you need a refill on your cardiac medications before your next appointment, please call your pharmacy*  Follow-Up: At [facility], you and your health needs are our priority.  As part of our continuing mission to provide you with exceptional heart care, we have created designated Provider Care Teams.  These Care Teams include your primary Cardiologist (physician) and Advanced Practice Providers (APPs -  Physician Assistants and Nurse Practitioners) who all work together to provide you with the care you need, when you need it.  We recommend signing up for the patient portal called "MyChart".  Sign up information is provided on this After Visit Summary.  MyChart is used to connect with patients for Virtual Visits (Telemedicine).  Patients are able to view lab/test results, encounter notes, upcoming appointments, etc.  Non-urgent messages can be sent to your provider as well.   To learn more about what you can do with MyChart, go to [email or web address].    Your next appointment:   12 month(s)  The format for your next appointment:   In Person  Provider:   James Hochrein, MD   Thank you for choosing [facility]!!     

## 2021-04-25 DIAGNOSIS — M810 Age-related osteoporosis without current pathological fracture: Secondary | ICD-10-CM | POA: Insufficient documentation

## 2021-04-26 DIAGNOSIS — M4856XA Collapsed vertebra, not elsewhere classified, lumbar region, initial encounter for fracture: Secondary | ICD-10-CM | POA: Diagnosis not present

## 2021-04-26 DIAGNOSIS — M40204 Unspecified kyphosis, thoracic region: Secondary | ICD-10-CM | POA: Diagnosis not present

## 2021-04-26 DIAGNOSIS — M2578 Osteophyte, vertebrae: Secondary | ICD-10-CM | POA: Diagnosis not present

## 2021-04-26 DIAGNOSIS — S22080A Wedge compression fracture of T11-T12 vertebra, initial encounter for closed fracture: Secondary | ICD-10-CM | POA: Diagnosis not present

## 2021-04-26 DIAGNOSIS — M545 Low back pain, unspecified: Secondary | ICD-10-CM | POA: Diagnosis not present

## 2021-04-26 DIAGNOSIS — M4854XA Collapsed vertebra, not elsewhere classified, thoracic region, initial encounter for fracture: Secondary | ICD-10-CM | POA: Diagnosis not present

## 2021-04-26 DIAGNOSIS — M5136 Other intervertebral disc degeneration, lumbar region: Secondary | ICD-10-CM | POA: Diagnosis not present

## 2021-05-01 DIAGNOSIS — M810 Age-related osteoporosis without current pathological fracture: Secondary | ICD-10-CM | POA: Diagnosis not present

## 2021-05-06 ENCOUNTER — Other Ambulatory Visit: Payer: Self-pay | Admitting: Cardiology

## 2021-05-08 DIAGNOSIS — B351 Tinea unguium: Secondary | ICD-10-CM | POA: Diagnosis not present

## 2021-05-08 DIAGNOSIS — M79676 Pain in unspecified toe(s): Secondary | ICD-10-CM | POA: Diagnosis not present

## 2021-05-08 DIAGNOSIS — L84 Corns and callosities: Secondary | ICD-10-CM | POA: Diagnosis not present

## 2021-05-08 DIAGNOSIS — E1142 Type 2 diabetes mellitus with diabetic polyneuropathy: Secondary | ICD-10-CM | POA: Diagnosis not present

## 2021-05-17 DIAGNOSIS — Z85828 Personal history of other malignant neoplasm of skin: Secondary | ICD-10-CM | POA: Diagnosis not present

## 2021-05-17 DIAGNOSIS — Z08 Encounter for follow-up examination after completed treatment for malignant neoplasm: Secondary | ICD-10-CM | POA: Diagnosis not present

## 2021-06-29 DIAGNOSIS — I482 Chronic atrial fibrillation, unspecified: Secondary | ICD-10-CM | POA: Diagnosis not present

## 2021-07-04 DIAGNOSIS — I700 Atherosclerosis of aorta: Secondary | ICD-10-CM | POA: Diagnosis not present

## 2021-07-04 DIAGNOSIS — G9009 Other idiopathic peripheral autonomic neuropathy: Secondary | ICD-10-CM | POA: Diagnosis not present

## 2021-07-04 DIAGNOSIS — E11610 Type 2 diabetes mellitus with diabetic neuropathic arthropathy: Secondary | ICD-10-CM | POA: Diagnosis not present

## 2021-07-04 DIAGNOSIS — J449 Chronic obstructive pulmonary disease, unspecified: Secondary | ICD-10-CM | POA: Diagnosis not present

## 2021-07-04 DIAGNOSIS — J3089 Other allergic rhinitis: Secondary | ICD-10-CM | POA: Diagnosis not present

## 2021-07-04 DIAGNOSIS — I482 Chronic atrial fibrillation, unspecified: Secondary | ICD-10-CM | POA: Diagnosis not present

## 2021-07-04 DIAGNOSIS — I10 Essential (primary) hypertension: Secondary | ICD-10-CM | POA: Diagnosis not present

## 2021-07-04 DIAGNOSIS — I25118 Atherosclerotic heart disease of native coronary artery with other forms of angina pectoris: Secondary | ICD-10-CM | POA: Diagnosis not present

## 2021-07-31 DIAGNOSIS — E1142 Type 2 diabetes mellitus with diabetic polyneuropathy: Secondary | ICD-10-CM | POA: Diagnosis not present

## 2021-07-31 DIAGNOSIS — B351 Tinea unguium: Secondary | ICD-10-CM | POA: Diagnosis not present

## 2021-07-31 DIAGNOSIS — L84 Corns and callosities: Secondary | ICD-10-CM | POA: Diagnosis not present

## 2021-07-31 DIAGNOSIS — M79676 Pain in unspecified toe(s): Secondary | ICD-10-CM | POA: Diagnosis not present

## 2021-08-01 ENCOUNTER — Other Ambulatory Visit: Payer: Self-pay | Admitting: Cardiology

## 2021-09-13 DIAGNOSIS — Z85828 Personal history of other malignant neoplasm of skin: Secondary | ICD-10-CM | POA: Diagnosis not present

## 2021-09-13 DIAGNOSIS — X32XXXD Exposure to sunlight, subsequent encounter: Secondary | ICD-10-CM | POA: Diagnosis not present

## 2021-09-13 DIAGNOSIS — Z08 Encounter for follow-up examination after completed treatment for malignant neoplasm: Secondary | ICD-10-CM | POA: Diagnosis not present

## 2021-09-13 DIAGNOSIS — L570 Actinic keratosis: Secondary | ICD-10-CM | POA: Diagnosis not present

## 2021-09-13 DIAGNOSIS — C44319 Basal cell carcinoma of skin of other parts of face: Secondary | ICD-10-CM | POA: Diagnosis not present

## 2021-10-10 DIAGNOSIS — G9009 Other idiopathic peripheral autonomic neuropathy: Secondary | ICD-10-CM | POA: Diagnosis not present

## 2021-10-10 DIAGNOSIS — J3089 Other allergic rhinitis: Secondary | ICD-10-CM | POA: Diagnosis not present

## 2021-10-10 DIAGNOSIS — E11610 Type 2 diabetes mellitus with diabetic neuropathic arthropathy: Secondary | ICD-10-CM | POA: Diagnosis not present

## 2021-10-10 DIAGNOSIS — J449 Chronic obstructive pulmonary disease, unspecified: Secondary | ICD-10-CM | POA: Diagnosis not present

## 2021-10-10 DIAGNOSIS — I482 Chronic atrial fibrillation, unspecified: Secondary | ICD-10-CM | POA: Diagnosis not present

## 2021-10-10 DIAGNOSIS — I25118 Atherosclerotic heart disease of native coronary artery with other forms of angina pectoris: Secondary | ICD-10-CM | POA: Diagnosis not present

## 2021-10-10 DIAGNOSIS — I700 Atherosclerosis of aorta: Secondary | ICD-10-CM | POA: Diagnosis not present

## 2021-10-10 DIAGNOSIS — I10 Essential (primary) hypertension: Secondary | ICD-10-CM | POA: Diagnosis not present

## 2021-10-18 DIAGNOSIS — Z08 Encounter for follow-up examination after completed treatment for malignant neoplasm: Secondary | ICD-10-CM | POA: Diagnosis not present

## 2021-10-18 DIAGNOSIS — Z85828 Personal history of other malignant neoplasm of skin: Secondary | ICD-10-CM | POA: Diagnosis not present

## 2021-10-18 DIAGNOSIS — L720 Epidermal cyst: Secondary | ICD-10-CM | POA: Diagnosis not present

## 2021-10-30 DIAGNOSIS — L84 Corns and callosities: Secondary | ICD-10-CM | POA: Diagnosis not present

## 2021-10-30 DIAGNOSIS — E1142 Type 2 diabetes mellitus with diabetic polyneuropathy: Secondary | ICD-10-CM | POA: Diagnosis not present

## 2021-10-30 DIAGNOSIS — M79676 Pain in unspecified toe(s): Secondary | ICD-10-CM | POA: Diagnosis not present

## 2021-10-30 DIAGNOSIS — B351 Tinea unguium: Secondary | ICD-10-CM | POA: Diagnosis not present

## 2021-11-04 ENCOUNTER — Other Ambulatory Visit: Payer: Self-pay | Admitting: Cardiology

## 2021-11-15 DIAGNOSIS — I482 Chronic atrial fibrillation, unspecified: Secondary | ICD-10-CM | POA: Diagnosis not present

## 2022-01-08 DIAGNOSIS — B351 Tinea unguium: Secondary | ICD-10-CM | POA: Diagnosis not present

## 2022-01-08 DIAGNOSIS — M79676 Pain in unspecified toe(s): Secondary | ICD-10-CM | POA: Diagnosis not present

## 2022-01-08 DIAGNOSIS — L84 Corns and callosities: Secondary | ICD-10-CM | POA: Diagnosis not present

## 2022-01-08 DIAGNOSIS — E1142 Type 2 diabetes mellitus with diabetic polyneuropathy: Secondary | ICD-10-CM | POA: Diagnosis not present

## 2022-01-09 DIAGNOSIS — I25118 Atherosclerotic heart disease of native coronary artery with other forms of angina pectoris: Secondary | ICD-10-CM | POA: Diagnosis not present

## 2022-01-09 DIAGNOSIS — I10 Essential (primary) hypertension: Secondary | ICD-10-CM | POA: Diagnosis not present

## 2022-01-09 DIAGNOSIS — Z7901 Long term (current) use of anticoagulants: Secondary | ICD-10-CM | POA: Diagnosis not present

## 2022-01-09 DIAGNOSIS — J3089 Other allergic rhinitis: Secondary | ICD-10-CM | POA: Diagnosis not present

## 2022-01-09 DIAGNOSIS — I482 Chronic atrial fibrillation, unspecified: Secondary | ICD-10-CM | POA: Diagnosis not present

## 2022-01-09 DIAGNOSIS — E11610 Type 2 diabetes mellitus with diabetic neuropathic arthropathy: Secondary | ICD-10-CM | POA: Diagnosis not present

## 2022-01-09 DIAGNOSIS — J449 Chronic obstructive pulmonary disease, unspecified: Secondary | ICD-10-CM | POA: Diagnosis not present

## 2022-01-09 DIAGNOSIS — L570 Actinic keratosis: Secondary | ICD-10-CM | POA: Diagnosis not present

## 2022-01-09 DIAGNOSIS — I700 Atherosclerosis of aorta: Secondary | ICD-10-CM | POA: Diagnosis not present

## 2022-01-09 DIAGNOSIS — G9009 Other idiopathic peripheral autonomic neuropathy: Secondary | ICD-10-CM | POA: Diagnosis not present

## 2022-02-03 ENCOUNTER — Other Ambulatory Visit: Payer: Self-pay | Admitting: Cardiology

## 2022-03-19 DIAGNOSIS — E1142 Type 2 diabetes mellitus with diabetic polyneuropathy: Secondary | ICD-10-CM | POA: Diagnosis not present

## 2022-03-19 DIAGNOSIS — B351 Tinea unguium: Secondary | ICD-10-CM | POA: Diagnosis not present

## 2022-03-19 DIAGNOSIS — M79676 Pain in unspecified toe(s): Secondary | ICD-10-CM | POA: Diagnosis not present

## 2022-03-19 DIAGNOSIS — L84 Corns and callosities: Secondary | ICD-10-CM | POA: Diagnosis not present

## 2022-04-11 DIAGNOSIS — I482 Chronic atrial fibrillation, unspecified: Secondary | ICD-10-CM | POA: Diagnosis not present

## 2022-04-11 DIAGNOSIS — I25118 Atherosclerotic heart disease of native coronary artery with other forms of angina pectoris: Secondary | ICD-10-CM | POA: Diagnosis not present

## 2022-04-11 DIAGNOSIS — I10 Essential (primary) hypertension: Secondary | ICD-10-CM | POA: Diagnosis not present

## 2022-04-11 DIAGNOSIS — J449 Chronic obstructive pulmonary disease, unspecified: Secondary | ICD-10-CM | POA: Diagnosis not present

## 2022-04-11 DIAGNOSIS — I700 Atherosclerosis of aorta: Secondary | ICD-10-CM | POA: Diagnosis not present

## 2022-04-11 DIAGNOSIS — J3089 Other allergic rhinitis: Secondary | ICD-10-CM | POA: Diagnosis not present

## 2022-04-11 DIAGNOSIS — Z7901 Long term (current) use of anticoagulants: Secondary | ICD-10-CM | POA: Diagnosis not present

## 2022-04-11 DIAGNOSIS — E11610 Type 2 diabetes mellitus with diabetic neuropathic arthropathy: Secondary | ICD-10-CM | POA: Diagnosis not present

## 2022-04-19 DIAGNOSIS — R911 Solitary pulmonary nodule: Secondary | ICD-10-CM | POA: Diagnosis not present

## 2022-04-19 DIAGNOSIS — R590 Localized enlarged lymph nodes: Secondary | ICD-10-CM | POA: Diagnosis not present

## 2022-04-19 DIAGNOSIS — K5730 Diverticulosis of large intestine without perforation or abscess without bleeding: Secondary | ICD-10-CM | POA: Diagnosis not present

## 2022-04-19 DIAGNOSIS — I700 Atherosclerosis of aorta: Secondary | ICD-10-CM | POA: Diagnosis not present

## 2022-04-19 DIAGNOSIS — J439 Emphysema, unspecified: Secondary | ICD-10-CM | POA: Diagnosis not present

## 2022-04-19 DIAGNOSIS — S22089A Unspecified fracture of T11-T12 vertebra, initial encounter for closed fracture: Secondary | ICD-10-CM | POA: Diagnosis not present

## 2022-04-23 DIAGNOSIS — I2510 Atherosclerotic heart disease of native coronary artery without angina pectoris: Secondary | ICD-10-CM | POA: Insufficient documentation

## 2022-04-23 NOTE — Progress Notes (Deleted)
Cardiology Office Note   Date:  04/23/2022   ID:  Jeremy Vargas, DOB 20-Jun-1930, MRN [id number]  PCP:  Neale Burly, MD  Cardiologist:   Minus Breeding, MD   No chief complaint on file.     History of Present Illness: Jeremy Vargas is a 88 y.o. male who presents for evaluation of atrial fibrillation. He has had a history of SVT ablated twice around 2010 when he lived in the [place]. He had atrial fibrillation following this and had cardioversion twice.  He is now in permanent atrial fib.  Since I last saw him ***   ***he has had no new cardiovascular complaints.  He continues to get basal cell cancers removed. The patient denies any new symptoms such as chest discomfort, neck or arm discomfort. There has been no new shortness of breath, PND or orthopnea. There have been no reported palpitations, presyncope or syncope.  He has been back problems slowly with a walker.  Past Medical History:  Diagnosis Date   Atrial fibrillation ([place])    Cardiac dysrhythmia, unspecified    Compression fracture of L4 lumbar vertebra    Compression fracture of T12 vertebra (HCC)    Diabetes mellitus without complication (HCC)    Other and unspecified hyperlipidemia    Sleep apnea    SVT (supraventricular tachycardia) (HCC)    Unspecified essential hypertension    Vitamin D deficiency     Past Surgical History:  Procedure Laterality Date   ABLATION     x2   CARDIOVERSION     x2   HIATAL HERNIA REPAIR     x2   KYPHOPLASTY     SKIN CANCER EXCISION       Current Outpatient Medications  Medication Sig Dispense Refill   BIOTIN PO Take 1 capsule by mouth daily.     diltiazem (CARDIZEM CD) 240 MG 24 hr capsule Take 1 capsule (240 mg total) by mouth daily. Schedule an appointment with cardiology for further refills, 1st attempt 90 capsule 0   furosemide (LASIX) 40 MG tablet Take 40 mg by mouth daily.     metFORMIN (GLUCOPHAGE) 1000 MG tablet Take 1,000 mg by mouth 2 (two) times daily with a  meal.      Multiple Vitamin (MULTIVITAMIN) tablet Take 1 tablet by mouth daily.     Omega-3 Fatty Acids (FISH OIL) 1000 MG CAPS Take 1 capsule by mouth daily.     tamsulosin (FLOMAX) 0.4 MG CAPS capsule Take 1 capsule (0.4 mg total) by mouth daily after supper. 90 capsule 11   warfarin (COUMADIN) 5 MG tablet Take 5 mg by mouth daily.     No current facility-administered medications for this visit.    Allergies:   Betadine [povidone iodine]    ROS:  Please see the history of present illness.   Otherwise, review of systems are positive for ***.   All other systems are reviewed and negative.    PHYSICAL EXAM: VS:  There were no vitals taken for this visit. , BMI There is no height or weight on file to calculate BMI. GENERAL:  Well appearing NECK:  No jugular venous distention, waveform within normal limits, carotid upstroke brisk and symmetric, no bruits, no thyromegaly LUNGS:  Clear to auscultation bilaterally CHEST:  Unremarkable HEART:  PMI not displaced or sustained,S1 and S2 within normal limits, no S3, no clicks, no rubs, *** murmurs, irregular  ABD:  Flat, positive bowel sounds normal in frequency in pitch, no bruits, no rebound,  no guarding, no midline pulsatile mass, no hepatomegaly, no splenomegaly EXT:  2 plus pulses throughout, no edema, no cyanosis no clubbing     ***GENERAL:  Well appearing NECK:  No jugular venous distention, waveform within normal limits, carotid upstroke brisk and symmetric, no bruits, no thyromegaly LUNGS:  Clear to auscultation bilaterally CHEST:  Unremarkable HEART:  PMI not displaced or sustained,S1 and S2 within normal limits, no S3,  no clicks, no rubs, no murmurs, irregular ABD:  Flat, positive bowel sounds normal in frequency in pitch, no bruits, no rebound, no guarding, no midline pulsatile mass, no hepatomegaly, no splenomegaly EXT:  2 plus pulses throughout, no edema, no cyanosis no clubbing   EKG:  EKG is  *** ordered today. The ekg  ordered today demonstrates atrial fibrillation, rate ***, left axis deviation, poor anterior R wave progression, no acute ST-T wave changes.  Premature ventricular contractions   Recent Labs: No results found for requested labs within last 8760 hours.    Lipid Panel    Component Value Date/Time   CHOL 185 06/09/2013 0959   TRIG 171 (H) 06/09/2013 0959   HDL 39 (L) 06/09/2013 0959   CHOLHDL 4.7 06/09/2013 0959   VLDL 34 06/09/2013 0959   LDLCALC 112 (H) 06/09/2013 0959      Wt Readings from Last 3 Encounters:  04/11/21 177 lb (80.3 kg)  03/22/20 186 lb (84.4 kg)  12/14/19 185 lb (83.9 kg)      Other studies Reviewed: Additional studies/ records that were reviewed today include: *** Review of the above records demonstrates:  Please see elsewhere in the note.     ASSESSMENT AND PLAN:  Atrial fibrillation -     Chronic atrial fib.   Jeremy Vargas has a CHA2DS2 - VASc score of 4 *** with a risk of stroke of 4% .    No change in therapy.    AORTIC CALCIFICATION/CORONARY CALCIFICATION:   ***  This was noted recently on the CT he had with follow-up pulmonary nodules.  I reviewed this result for this visit.  He has no symptoms related to this.  He can continue with primary risk reduction.  HTN -  The blood pressure is ***  well controlled.  No change therapy.     Current medicines are reviewed at length with the patient today.  The patient does not have concerns regarding medicines.  The following changes have been made:  ***  Labs/ tests ordered today include:  ***  No orders of the defined types were placed in this encounter.    Disposition:   FU with me in ***    Signed, Minus Breeding, MD  04/23/2022 9:25 PM    [facility]

## 2022-04-24 ENCOUNTER — Ambulatory Visit: Payer: Medicare Other | Admitting: Cardiology

## 2022-04-24 DIAGNOSIS — I2510 Atherosclerotic heart disease of native coronary artery without angina pectoris: Secondary | ICD-10-CM

## 2022-04-24 DIAGNOSIS — I10 Essential (primary) hypertension: Secondary | ICD-10-CM

## 2022-04-24 DIAGNOSIS — I4821 Permanent atrial fibrillation: Secondary | ICD-10-CM

## 2022-05-01 ENCOUNTER — Other Ambulatory Visit: Payer: Self-pay | Admitting: Cardiology

## 2022-05-01 DIAGNOSIS — R590 Localized enlarged lymph nodes: Secondary | ICD-10-CM | POA: Diagnosis not present

## 2022-05-17 DIAGNOSIS — I482 Chronic atrial fibrillation, unspecified: Secondary | ICD-10-CM | POA: Diagnosis not present

## 2022-05-28 DIAGNOSIS — E1142 Type 2 diabetes mellitus with diabetic polyneuropathy: Secondary | ICD-10-CM | POA: Diagnosis not present

## 2022-05-28 DIAGNOSIS — B351 Tinea unguium: Secondary | ICD-10-CM | POA: Diagnosis not present

## 2022-05-28 DIAGNOSIS — L84 Corns and callosities: Secondary | ICD-10-CM | POA: Diagnosis not present

## 2022-05-28 DIAGNOSIS — M79676 Pain in unspecified toe(s): Secondary | ICD-10-CM | POA: Diagnosis not present

## 2022-06-21 DIAGNOSIS — I482 Chronic atrial fibrillation, unspecified: Secondary | ICD-10-CM | POA: Diagnosis not present

## 2022-07-11 DIAGNOSIS — I10 Essential (primary) hypertension: Secondary | ICD-10-CM | POA: Diagnosis not present

## 2022-07-11 DIAGNOSIS — I482 Chronic atrial fibrillation, unspecified: Secondary | ICD-10-CM | POA: Diagnosis not present

## 2022-07-11 DIAGNOSIS — L570 Actinic keratosis: Secondary | ICD-10-CM | POA: Diagnosis not present

## 2022-07-11 DIAGNOSIS — E11610 Type 2 diabetes mellitus with diabetic neuropathic arthropathy: Secondary | ICD-10-CM | POA: Diagnosis not present

## 2022-07-11 DIAGNOSIS — H6240 Otitis externa in other diseases classified elsewhere, unspecified ear: Secondary | ICD-10-CM | POA: Diagnosis not present

## 2022-07-11 DIAGNOSIS — I700 Atherosclerosis of aorta: Secondary | ICD-10-CM | POA: Diagnosis not present

## 2022-07-11 DIAGNOSIS — J449 Chronic obstructive pulmonary disease, unspecified: Secondary | ICD-10-CM | POA: Diagnosis not present

## 2022-07-11 DIAGNOSIS — I25118 Atherosclerotic heart disease of native coronary artery with other forms of angina pectoris: Secondary | ICD-10-CM | POA: Diagnosis not present

## 2022-07-11 DIAGNOSIS — J3089 Other allergic rhinitis: Secondary | ICD-10-CM | POA: Diagnosis not present

## 2022-07-11 DIAGNOSIS — H6120 Impacted cerumen, unspecified ear: Secondary | ICD-10-CM | POA: Diagnosis not present

## 2022-07-28 ENCOUNTER — Other Ambulatory Visit: Payer: Self-pay | Admitting: Cardiology

## 2022-08-16 DIAGNOSIS — I482 Chronic atrial fibrillation, unspecified: Secondary | ICD-10-CM | POA: Diagnosis not present

## 2022-08-21 DIAGNOSIS — L97521 Non-pressure chronic ulcer of other part of left foot limited to breakdown of skin: Secondary | ICD-10-CM | POA: Diagnosis not present

## 2022-09-10 ENCOUNTER — Telehealth: Payer: Self-pay | Admitting: Cardiology

## 2022-09-10 NOTE — Telephone Encounter (Signed)
Called patient to schedule recall. He says he cannot come in due to mobility issues and his heart is doing fine.

## 2022-10-24 DIAGNOSIS — I25118 Atherosclerotic heart disease of native coronary artery with other forms of angina pectoris: Secondary | ICD-10-CM | POA: Diagnosis not present

## 2022-10-24 DIAGNOSIS — J449 Chronic obstructive pulmonary disease, unspecified: Secondary | ICD-10-CM | POA: Diagnosis not present

## 2022-10-24 DIAGNOSIS — I10 Essential (primary) hypertension: Secondary | ICD-10-CM | POA: Diagnosis not present

## 2022-10-24 DIAGNOSIS — E11610 Type 2 diabetes mellitus with diabetic neuropathic arthropathy: Secondary | ICD-10-CM | POA: Diagnosis not present

## 2022-10-24 DIAGNOSIS — I700 Atherosclerosis of aorta: Secondary | ICD-10-CM | POA: Diagnosis not present

## 2022-10-24 DIAGNOSIS — J3089 Other allergic rhinitis: Secondary | ICD-10-CM | POA: Diagnosis not present

## 2022-10-24 DIAGNOSIS — I482 Chronic atrial fibrillation, unspecified: Secondary | ICD-10-CM | POA: Diagnosis not present

## 2022-11-01 ENCOUNTER — Other Ambulatory Visit: Payer: Self-pay | Admitting: Cardiology

## 2023-02-10 ENCOUNTER — Other Ambulatory Visit: Payer: Self-pay | Admitting: Cardiology

## 2023-04-24 DIAGNOSIS — I482 Chronic atrial fibrillation, unspecified: Secondary | ICD-10-CM | POA: Diagnosis not present

## 2023-04-24 DIAGNOSIS — I10 Essential (primary) hypertension: Secondary | ICD-10-CM | POA: Diagnosis not present

## 2023-04-24 DIAGNOSIS — I25118 Atherosclerotic heart disease of native coronary artery with other forms of angina pectoris: Secondary | ICD-10-CM | POA: Diagnosis not present

## 2023-04-24 DIAGNOSIS — J449 Chronic obstructive pulmonary disease, unspecified: Secondary | ICD-10-CM | POA: Diagnosis not present

## 2023-04-24 DIAGNOSIS — J3089 Other allergic rhinitis: Secondary | ICD-10-CM | POA: Diagnosis not present

## 2023-04-24 DIAGNOSIS — E11610 Type 2 diabetes mellitus with diabetic neuropathic arthropathy: Secondary | ICD-10-CM | POA: Diagnosis not present

## 2023-04-24 DIAGNOSIS — I700 Atherosclerosis of aorta: Secondary | ICD-10-CM | POA: Diagnosis not present

## 2023-05-02 ENCOUNTER — Other Ambulatory Visit: Payer: Self-pay | Admitting: Cardiology

## 2023-06-08 ENCOUNTER — Other Ambulatory Visit: Payer: Self-pay | Admitting: Cardiology

## 2023-07-07 DIAGNOSIS — E11610 Type 2 diabetes mellitus with diabetic neuropathic arthropathy: Secondary | ICD-10-CM | POA: Diagnosis not present

## 2023-07-07 DIAGNOSIS — S83512A Sprain of anterior cruciate ligament of left knee, initial encounter: Secondary | ICD-10-CM | POA: Diagnosis not present

## 2023-07-13 ENCOUNTER — Other Ambulatory Visit: Payer: Self-pay | Admitting: Cardiology

## 2023-07-14 DIAGNOSIS — Z7984 Long term (current) use of oral hypoglycemic drugs: Secondary | ICD-10-CM | POA: Diagnosis not present

## 2023-07-14 DIAGNOSIS — E43 Unspecified severe protein-calorie malnutrition: Secondary | ICD-10-CM | POA: Diagnosis not present

## 2023-07-14 DIAGNOSIS — I25118 Atherosclerotic heart disease of native coronary artery with other forms of angina pectoris: Secondary | ICD-10-CM | POA: Diagnosis not present

## 2023-07-14 DIAGNOSIS — Z7901 Long term (current) use of anticoagulants: Secondary | ICD-10-CM | POA: Diagnosis not present

## 2023-07-14 DIAGNOSIS — R531 Weakness: Secondary | ICD-10-CM | POA: Diagnosis not present

## 2023-07-14 DIAGNOSIS — I10 Essential (primary) hypertension: Secondary | ICD-10-CM | POA: Diagnosis not present

## 2023-07-14 DIAGNOSIS — T83511A Infection and inflammatory reaction due to indwelling urethral catheter, initial encounter: Secondary | ICD-10-CM | POA: Diagnosis not present

## 2023-07-14 DIAGNOSIS — N3001 Acute cystitis with hematuria: Secondary | ICD-10-CM | POA: Diagnosis not present

## 2023-07-14 DIAGNOSIS — I482 Chronic atrial fibrillation, unspecified: Secondary | ICD-10-CM | POA: Diagnosis not present

## 2023-07-14 DIAGNOSIS — J449 Chronic obstructive pulmonary disease, unspecified: Secondary | ICD-10-CM | POA: Diagnosis not present

## 2023-07-14 DIAGNOSIS — Z9181 History of falling: Secondary | ICD-10-CM | POA: Diagnosis not present

## 2023-07-14 DIAGNOSIS — Z87891 Personal history of nicotine dependence: Secondary | ICD-10-CM | POA: Diagnosis not present

## 2023-07-14 DIAGNOSIS — E11610 Type 2 diabetes mellitus with diabetic neuropathic arthropathy: Secondary | ICD-10-CM | POA: Diagnosis not present

## 2023-07-14 DIAGNOSIS — S83512D Sprain of anterior cruciate ligament of left knee, subsequent encounter: Secondary | ICD-10-CM | POA: Diagnosis not present

## 2023-07-20 ENCOUNTER — Emergency Department (HOSPITAL_COMMUNITY)
Admission: EM | Admit: 2023-07-20 | Discharge: 2023-07-20 | Disposition: A | Discharge status: Home / Self Care | Payer: Medicare Other | Attending: Emergency Medicine | Admitting: Emergency Medicine

## 2023-07-20 ENCOUNTER — Other Ambulatory Visit: Payer: Self-pay

## 2023-07-20 ENCOUNTER — Encounter (HOSPITAL_COMMUNITY): Payer: Self-pay

## 2023-07-20 DIAGNOSIS — R339 Retention of urine, unspecified: Secondary | ICD-10-CM | POA: Insufficient documentation

## 2023-07-20 DIAGNOSIS — Z7901 Long term (current) use of anticoagulants: Secondary | ICD-10-CM | POA: Diagnosis not present

## 2023-07-20 DIAGNOSIS — E119 Type 2 diabetes mellitus without complications: Secondary | ICD-10-CM | POA: Insufficient documentation

## 2023-07-20 DIAGNOSIS — K5641 Fecal impaction: Secondary | ICD-10-CM | POA: Insufficient documentation

## 2023-07-20 DIAGNOSIS — K5900 Constipation, unspecified: Secondary | ICD-10-CM | POA: Diagnosis not present

## 2023-07-20 DIAGNOSIS — R338 Other retention of urine: Secondary | ICD-10-CM

## 2023-07-20 DIAGNOSIS — Z7984 Long term (current) use of oral hypoglycemic drugs: Secondary | ICD-10-CM | POA: Insufficient documentation

## 2023-07-20 LAB — URINALYSIS, ROUTINE W REFLEX MICROSCOPIC
Bilirubin Urine: NEGATIVE
Glucose, UA: NEGATIVE mg/dL
Hgb urine dipstick: NEGATIVE
Ketones, ur: NEGATIVE mg/dL
Leukocytes,Ua: NEGATIVE
Nitrite: NEGATIVE
Protein, ur: NEGATIVE mg/dL
Specific Gravity, Urine: 1.008 (ref 1.005–1.030)
pH: 7 (ref 5.0–8.0)

## 2023-07-20 NOTE — ED Notes (Signed)
Repeat Urine retention measurement reads > urine.

## 2023-07-20 NOTE — ED Triage Notes (Signed)
Pt arrived via [facility] from home c/o urinary retention X 24hrs and fecal retention X 1 week. Pt denies pain, decreased appetite. Pt has > retained urine on bladder scan in Triage.

## 2023-07-20 NOTE — ED Provider Notes (Signed)
[facility] EMERGENCY DEPARTMENT AT [facility] Provider Note   CSN: [id number] Arrival date & time: 07/20/23  4098     History  Chief Complaint  Patient presents with   Urinary Retention    Jeremy Vargas is a 89 y.o. male.  The history is provided by the patient.  He has history of diabetes, atrial fibrillation anticoagulated on warfarin and comes in because of inability to urinate for the last 24 hours.  He states that he has not been able to have a bowel movement for the last week in spite of using bisacodyl.  He feels that the stool is pressing on his prostate making it difficult for him to urinate.   Home Medications Prior to Admission medications   Medication Sig Start Date End Date Taking? Authorizing Provider  BIOTIN PO Take 1 capsule by mouth daily.    [provider]  diltiazem (CARDIZEM CD) 240 MG 24 hr capsule TAKE ONE (1) CAPSULE EACH DAY 07/14/23   Rollene Rotunda, MD  furosemide (LASIX) 40 MG tablet Take 40 mg by mouth daily.    [provider]  metFORMIN (GLUCOPHAGE) 1000 MG tablet Take 1,000 mg by mouth 2 (two) times daily with a meal.     [provider]  Multiple Vitamin (MULTIVITAMIN) tablet Take 1 tablet by mouth daily.    [provider]  Omega-3 Fatty Acids (FISH OIL) 1000 MG CAPS Take 1 capsule by mouth daily.    [provider]  tamsulosin (FLOMAX) 0.4 MG CAPS capsule Take 1 capsule (0.4 mg total) by mouth daily after supper. 11/09/19   Marcine Matar, MD  warfarin (COUMADIN) 5 MG tablet Take 5 mg by mouth daily.    [provider]      Allergies    Betadine [povidone iodine]    Review of Systems   Review of Systems  All other systems reviewed and are negative.   Physical Exam Updated Vital Signs BP (!) 151/92   Pulse 87   Temp 97.6 F (36.4 C) (Oral)   Resp 18   Ht 5\' 9"  (1.753 m)   Wt 68 kg   SpO2 95%   BMI 22.15 kg/m  Physical Exam Vitals and nursing note reviewed.   89  year old male, resting comfortably and in no acute distress. Vital signs are significant for elevated blood pressure. Oxygen saturation is 95%, which is normal. Head is normocephalic and atraumatic. PERRLA, EOMI. Oropharynx is clear. Neck is nontender and supple without adenopathy or JVD. Back is nontender and there is no CVA tenderness. Lungs are clear without rales, wheezes, or rhonchi. Chest is nontender. Heart has regular rate and rhythm without murmur. Abdomen is soft, flat, nontender. Rectal: Fecal impaction present. Extremities have no cyanosis or edema, full range of motion is present. Skin is warm and dry without rash. Neurologic: Awake and alert.  Moves all extremities equally.  ED Results / Procedures / Treatments   Labs (all labs ordered are listed, but only abnormal results are displayed) Labs Reviewed  URINALYSIS, ROUTINE W REFLEX MICROSCOPIC   Procedures Fecal disimpaction  Date/Time: 07/20/2023 5:25 AM  Performed by: Dione Booze, MD Authorized by: Dione Booze, MD  Consent: Verbal consent obtained. Written consent not obtained. Risks and benefits: risks, benefits and alternatives were discussed Consent given by: patient Patient understanding: patient states understanding of the procedure being performed Patient consent: the patient's understanding of the procedure matches consent given Procedure consent: procedure consent matches procedure scheduled Relevant documents: relevant  documents present and verified Site marked: the operative site was marked Required items: required blood products, implants, devices, and special equipment available Patient identity confirmed: verbally with patient and arm band Time out: Immediately prior to procedure a "time out" was called to verify the correct patient, procedure, equipment, support staff and site/side marked as required. Local anesthesia used: no  Anesthesia: Local anesthesia used: no  Sedation: Patient sedated:  no  Patient tolerance: patient tolerated the procedure well with no immediate complications       Medications Ordered in ED Medications - No data to display  ED Course/ Medical Decision Making/ A&P                                 Medical Decision Making Amount and/or Complexity of Data Reviewed Labs: ordered.   Acute urinary retention.  Fecal impaction.  Prior to my seeing the patient, a bladder scan showed over 1400 mL of urine in the bladder.  A Foley catheter had been placed and drained clear urine.  I did a manual disimpaction with significant relief.  I have ordered a soapsuds enema.  He had modest results from the soapsuds enema but states that he was feeling much better.  I am discharging him with instructions to follow-up with his urologist to decide when to remove his urinary catheter.  Final Clinical Impression(s) / ED Diagnoses Final diagnoses:  Acute urinary retention  Fecal impaction (HCC)  Anticoagulated on warfarin    Rx / DC Orders ED Discharge Orders     None         Dione Booze, MD 07/20/23 [phone]

## 2023-07-20 NOTE — ED Notes (Signed)
Pt reports he is bed bound, non-ambulatory at home and would require EMS transport home following d/c.

## 2023-07-20 NOTE — ED Notes (Signed)
Patient Alert and oriented to baseline. Stable and ambulatory to baseline. Patient verbalized understanding of the discharge instructions.  Patient belongings were taken by the patient.   

## 2023-07-20 NOTE — ED Notes (Signed)
Soap suds enema administered with moderate results. Pt placed on bedpan, call bell within reach. Dr. Preston Fleeting notified.

## 2023-07-23 ENCOUNTER — Telehealth: Payer: Self-pay

## 2023-07-23 NOTE — Telephone Encounter (Signed)
Patient called to schedule a follow up after ER for catheter placement. Appointment scheduled with patient for September 6th 10am . Patient will arrange transportation.

## 2023-07-24 DIAGNOSIS — N3001 Acute cystitis with hematuria: Secondary | ICD-10-CM | POA: Diagnosis not present

## 2023-07-24 DIAGNOSIS — S83512D Sprain of anterior cruciate ligament of left knee, subsequent encounter: Secondary | ICD-10-CM | POA: Diagnosis not present

## 2023-07-24 DIAGNOSIS — T83511A Infection and inflammatory reaction due to indwelling urethral catheter, initial encounter: Secondary | ICD-10-CM | POA: Diagnosis not present

## 2023-07-24 DIAGNOSIS — E11610 Type 2 diabetes mellitus with diabetic neuropathic arthropathy: Secondary | ICD-10-CM | POA: Diagnosis not present

## 2023-07-24 DIAGNOSIS — N401 Enlarged prostate with lower urinary tract symptoms: Secondary | ICD-10-CM | POA: Insufficient documentation

## 2023-07-24 DIAGNOSIS — N529 Male erectile dysfunction, unspecified: Secondary | ICD-10-CM | POA: Insufficient documentation

## 2023-07-24 DIAGNOSIS — R531 Weakness: Secondary | ICD-10-CM | POA: Diagnosis not present

## 2023-07-24 DIAGNOSIS — E43 Unspecified severe protein-calorie malnutrition: Secondary | ICD-10-CM | POA: Diagnosis not present

## 2023-07-24 NOTE — Progress Notes (Signed)
Name: Jeremy Vargas DOB: Oct 21, 1930 MRN: [id number]  History of Present Illness: Jeremy Vargas is a 89 y.o. male who presents alone today as a new patient / to re-establish care at [facility]. All available relevant medical records have been reviewed.  - GU history:  1. BPH with BOO and LUTS (incomplete bladder emptying).  - Does double voiding.  - Taking Flomax 0.4 mg daily.  2. Erectile dysfunction.  At last visit with Dr. Retta Diones on 01/11/2020: - Cystoscopic evaluation showed prostatomegaly with bladder outlet obstruction.  - The plan was "Return in 6 mo for follow-up w/ PVR. If his urinary sx's worsen, we will discuss surgical options at this visit. Otherwise, we will keep him on medical management."   Recent history:  07/20/2023: Seen in ER for acute urinary retention and fecal impaction.   - Per note: "inability to urinate for the last 24 hours. He states that he has not been able to have a bowel movement for the last week in spite of using bisacodyl. He feels that the stool is pressing on his prostate making it difficult for him to urinate."  - Bladder scan showed >1400 mL of urine in the bladder.   - Foley catheter placed.  - He was manually disimpacted with significant relief and also had modest results from a soapsuds enema.  Today: He reports history of urinary retention a few years ago.  He reports that since Saturday 07/19/2023 he has been unable to walk, urinate, or control his bowels. He denies any known precipitating causes for his sudden debility but reports history of gradual generalized muscle deconditioning. He presents on a stretcher today; transported here via EMS.   He states Physical Therapy came out once since he left the ER and he denies seeing his PCP since then - states PCP requires visit in office and that he has been unable to find a way there because neither he nor his wife drive and they have no family close by to help. He lives at home;  denies having home health nursing.    Medications: Current Outpatient Medications  Medication Sig Dispense Refill   BIOTIN PO Take 1 capsule by mouth daily.     diltiazem (CARDIZEM CD) 240 MG 24 hr capsule TAKE ONE (1) CAPSULE EACH DAY 30 capsule 0   furosemide (LASIX) 40 MG tablet Take 40 mg by mouth daily.     metFORMIN (GLUCOPHAGE) 1000 MG tablet Take 1,000 mg by mouth 2 (two) times daily with a meal.      Multiple Vitamin (MULTIVITAMIN) tablet Take 1 tablet by mouth daily.     Omega-3 Fatty Acids (FISH OIL) 1000 MG CAPS Take 1 capsule by mouth daily.     warfarin (COUMADIN) 5 MG tablet Take 5 mg by mouth daily.     tamsulosin (FLOMAX) 0.4 MG CAPS capsule Take 1 capsule (0.4 mg total) by mouth in the morning and at bedtime. 60 capsule 11   No current facility-administered medications for this visit.    Allergies: Allergies  Allergen Reactions   Betadine [Povidone Iodine] Rash    Past Medical History:  Diagnosis Date   Atrial fibrillation (HCC)    Cardiac dysrhythmia, unspecified    Compression fracture of L4 lumbar vertebra    Compression fracture of T12 vertebra (HCC)    Diabetes mellitus without complication (HCC)    Other and unspecified hyperlipidemia    Sleep apnea    SVT (supraventricular tachycardia)    Unspecified essential hypertension  Vitamin D deficiency    Past Surgical History:  Procedure Laterality Date   ABLATION     x2   CARDIOVERSION     x2   HIATAL HERNIA REPAIR     x2   KYPHOPLASTY     SKIN CANCER EXCISION     Family History  Problem Relation Age of Onset   COPD Mother    Cancer Father    Heart disease Son    Heart disease Maternal Grandmother    Social History   Socioeconomic History   Marital status: Married    Spouse name: Not on file   Number of children: 3   Years of education: Not on file   Highest education level: Not on file  Occupational History   Not on file  Tobacco Use   Smoking status: Former   Smokeless tobacco:  Never  Vaping Use   Vaping status: Never Used  Substance and Sexual Activity   Alcohol use: No   Drug use: No   Sexual activity: Not Currently  Other Topics Concern   Not on file  Social History Narrative   Not on file   Social Determinants of Health   Financial Resource Strain: Not on file  Food Insecurity: Not on file  Transportation Needs: Not on file  Physical Activity: Not on file  Stress: Not on file  Social Connections: Unknown (04/01/2022)   Received from [facility]   Social Network    Social Network: Not on file  Intimate Partner Violence: Unknown (02/21/2022)   Received from [facility], [facility]   HITS    Physically Hurt: Not on file    Insult or Talk Down To: Not on file    Threaten Physical Harm: Not on file    Scream or Curse: Not on file    SUBJECTIVE  Review of Systems Constitutional: Patient reports recent sudden change in strength / mobility (as per HPI) lntegumentary: Patient denies any rashes or pruritus Cardiovascular: Patient denies chest pain or syncope Respiratory: Patient denies shortness of breath Gastrointestinal: Patient reports new fecal incontinence Musculoskeletal: Patient reports new muscle weakness Neurologic: Patient denies convulsions or seizures Psychiatric: Patient denies memory problems Allergic/Immunologic: Patient denies recent allergic reaction(s) Hematologic/Lymphatic: Patient denies bleeding tendencies Endocrine: Patient denies heat/cold intolerance  GU: As per HPI.  OBJECTIVE Vitals:   07/25/23 1028  BP: 134/70  Pulse: 69  Temp: (!) 97.4 F (36.3 C)   There is no height or weight on file to calculate BMI.  Physical Examination Constitutional: Patient is non-toxic appearing  Cardiovascular: No pallor Respiratory: The patient does not have audible wheezing/stridor; respirations do not appear labored  Gastrointestinal: Abdomen non-distended Musculoskeletal: Normal ROM of UEs  Skin: No  obvious rashes/open sores  Neurologic: CN 2-12 grossly intact Psychiatric: Answered questions appropriately with normal affect  Hematologic/Lymphatic/Immunologic: No obvious bruises or sites of spontaneous bleeding  GU: Catheter draining clear yellow urine.   ASSESSMENT Urinary retention - Plan: Bladder Voiding Trial, INSERT,TEMP INDWELLING BLAD CATH,SIMPLE, tamsulosin (FLOMAX) 0.4 MG CAPS capsule  Benign prostatic hyperplasia with incomplete bladder emptying - Plan: Bladder Voiding Trial, INSERT,TEMP INDWELLING BLAD CATH,SIMPLE, tamsulosin (FLOMAX) 0.4 MG CAPS capsule  Constipation, unspecified constipation type - Plan: Bladder Voiding Trial, INSERT,TEMP INDWELLING BLAD CATH,SIMPLE  Erectile dysfunction, unspecified erectile dysfunction type - Plan: Bladder Voiding Trial, INSERT,TEMP INDWELLING BLAD CATH,SIMPLE  Foley catheter in place - Plan: Bladder Voiding Trial, INSERT,TEMP INDWELLING BLAD CATH,SIMPLE, tamsulosin (FLOMAX) 0.4 MG CAPS capsule  Ambulatory dysfunction - Plan:  Ambulatory referral to Social Work  Type 2 diabetes mellitus with other specified complication, without long-term current use of insulin (HCC) - Plan: Ambulatory referral to Social Work, AMB Referral to [employer] Coordinaton ([facility] Patients)  Permanent atrial fibrillation (HCC) - Plan: Ambulatory referral to Social Work, AMB Referral to [employer] Coordinaton ([facility] Patients)  Essential hypertension - Plan: Ambulatory referral to Social Work, AMB Referral to [employer] Coordinaton ([facility] Patients)  Physical deconditioning - Plan: Ambulatory referral to Social Work, AMB Referral to [employer] Coordinaton ([facility]O Patients)  We discussed pt's urinary retention and possible etiologies including temporary detrusor areflexia, neurogenic bladder, BPH, constipation, anticholinergic medication use. There is also some concern for possible cauda equina syndrome given recent loss of bowel control and lower extremity  weakness, however he has risk factors for neurogenic bladder / bowel (including T2DM) and reports that his loss of muscle tone has been gradual in onset so cauda equina seems less likely.  Failed office voiding trial today. Foley catheter to remain in place. We agreed to increase Flomax 0.4 mg to 2x/day and to proceed with cystoscopy for further evaluation. Discussed possible indication for prostate / bladder outlet procedure based on findings. Also discussed possible need for further evaluation in the future via urodynamic testing.  For his non-GU issues he was strongly advised to follow up promptly with his PCP. Due to his transportation and other issues with social determinants of health, he was offered referrals for Social Work and [employer].   Pt verbalized understanding and agreement. All questions were answered.  PLAN Advised the following: Foley catheter to remain in place.  Take Flomax 0.4 mg twice per day.  Patient advised to follow up promptly with PCP.  Social Work referral placed. Community Care Coordination referral placed.  Return for 1st available cystoscopy with any urology MD.  Orders Placed This Encounter  Procedures   Ambulatory referral to Social Work    Referral Priority:   Urgent    Referral Type:   Consultation    Referral Reason:   Specialty Services Required    Number of Visits Requested:   1   AMB Referral to Community Care Coordinaton ([facility] Patients)    Referral Priority:   Routine    Referral Type:   Consultation    Referral Reason:   Care Coordination    Number of Visits Requested:   1   Bladder Voiding Trial   INSERT,TEMP INDWELLING BLAD CATH,SIMPLE    It has been explained that the patient is to follow regularly with their PCP in addition to all other providers involved in their care and to follow instructions provided by these respective offices. Patient advised to contact urology clinic if any urologic-pertaining questions, concerns,  new symptoms or problems arise in the interim period.  There are no Patient Instructions on file for this visit.  Electronically signed by:  Donnita Falls, MSN, FNP-C, CUNP 07/25/2023 11:33 AM

## 2023-07-25 ENCOUNTER — Encounter: Payer: Self-pay | Admitting: Urology

## 2023-07-25 ENCOUNTER — Ambulatory Visit (INDEPENDENT_AMBULATORY_CARE_PROVIDER_SITE_OTHER): Payer: Medicare Other | Admitting: Urology

## 2023-07-25 ENCOUNTER — Telehealth: Payer: Self-pay | Admitting: *Deleted

## 2023-07-25 ENCOUNTER — Telehealth: Payer: Self-pay

## 2023-07-25 VITALS — BP 134/70 | HR 69 | Temp 97.4°F

## 2023-07-25 DIAGNOSIS — E119 Type 2 diabetes mellitus without complications: Secondary | ICD-10-CM | POA: Insufficient documentation

## 2023-07-25 DIAGNOSIS — Z978 Presence of other specified devices: Secondary | ICD-10-CM

## 2023-07-25 DIAGNOSIS — R339 Retention of urine, unspecified: Secondary | ICD-10-CM

## 2023-07-25 DIAGNOSIS — N401 Enlarged prostate with lower urinary tract symptoms: Secondary | ICD-10-CM | POA: Diagnosis not present

## 2023-07-25 DIAGNOSIS — E1169 Type 2 diabetes mellitus with other specified complication: Secondary | ICD-10-CM

## 2023-07-25 DIAGNOSIS — R5381 Other malaise: Secondary | ICD-10-CM | POA: Diagnosis not present

## 2023-07-25 DIAGNOSIS — R531 Weakness: Secondary | ICD-10-CM | POA: Diagnosis not present

## 2023-07-25 DIAGNOSIS — N529 Male erectile dysfunction, unspecified: Secondary | ICD-10-CM | POA: Diagnosis not present

## 2023-07-25 DIAGNOSIS — I10 Essential (primary) hypertension: Secondary | ICD-10-CM | POA: Diagnosis not present

## 2023-07-25 DIAGNOSIS — Z743 Need for continuous supervision: Secondary | ICD-10-CM | POA: Diagnosis not present

## 2023-07-25 DIAGNOSIS — I4821 Permanent atrial fibrillation: Secondary | ICD-10-CM

## 2023-07-25 DIAGNOSIS — K5900 Constipation, unspecified: Secondary | ICD-10-CM

## 2023-07-25 DIAGNOSIS — R262 Difficulty in walking, not elsewhere classified: Secondary | ICD-10-CM | POA: Insufficient documentation

## 2023-07-25 MED ORDER — TAMSULOSIN HCL 0.4 MG PO CAPS: 0.4000 mg | ORAL_CAPSULE | Freq: Two times a day (BID) | ORAL | 11 refills | Status: AC

## 2023-07-25 NOTE — Progress Notes (Signed)
Fill and Pull Catheter Removal  Patient is present today for a catheter removal.  Patient was cleaned and prepped in a sterile fashion 10ml of sterile water/ saline was instilled into the bladder when the patient felt the urge to urinate. of water was then drained from the balloon.  A 16FR foley silcone cath was removed from the bladder no complications were noted .  Patient as then given some time to void on their own.  Patient cannot void  0ml on their own after some time.  Patient tolerated well.  Simple Catheter Placement  Due to urinary retention patient is present today for a foley cath placement.  Patient was cleaned and prepped in a sterile fashion with betadine. A 16 FR foley catheter was inserted, urine return was noted  600 ml, urine was yellow in color.  The balloon was filled with 10cc of sterile water.  A night bag was attached for drainage. Patient was also given a night bag to take home and was given instruction on how to change from one bag to another.  Patient was given instruction on proper catheter care.  Patient tolerated well, no complications were noted    Performed by: Kennyth Lose, CMA  Follow up/ Additional notes: N/A

## 2023-07-25 NOTE — Telephone Encounter (Signed)
   Telephone encounter was:  Successful.  07/25/2023 Name: Jeremy Vargas MRN: [id number] DOB: 02/21/1930  Jeremy Vargas is a 89 y.o. year old male who is a primary care patient of Hasanaj, Myra Gianotti, MD . The community resource team was consulted for assistance with Transportation Needs   Care guide performed the following interventions: Patient provided with information about care guide support team and interviewed to confirm resource needs.PT is in need of transportation /non emergancy. Pt is bedbound and has 2 upcoming appointments. Dr. Olena Leatherwood PCP office 07/31/23 : [address], [place], [place] [address] at 1:00 and [facility] 9/30 at 230. I will be following back up with the pt next week   Follow Up Plan:  Care guide will follow up with patient by phone over the next week   [facility]  [facility], [facility] Guide, Phone: [phone] Website: [email or web address]

## 2023-07-25 NOTE — Progress Notes (Signed)
  Care Coordination   Note   07/25/2023 Name: Jeremy Vargas MRN: [id number] DOB: 11-18-1930  Jeremy Vargas is a 89 y.o. year old male who sees Hasanaj, Myra Gianotti, MD for primary care. I reached out to Jeremy Vargas by phone today to offer care coordination services.  Jeremy Vargas was given information about Care Coordination services today including:   The Care Coordination services include support from the care team which includes your Nurse Coordinator, Clinical Social Worker, or Pharmacist.  The Care Coordination team is here to help remove barriers to the health concerns and goals most important to you. Care Coordination services are voluntary, and the patient may decline or stop services at any time by request to their care team member.   Care Coordination Consent Status: Patient agreed to services and verbal consent obtained.   Follow up plan:  Telephone appointment with care coordination team member scheduled for:  07/28/23 RNCM and 08/01/23 at 3:00,   Encounter Outcome:  Patient Scheduled    Care Coordination  Note  07/25/2023 Name: Jeremy Vargas MRN: [id number] DOB: Feb 26, 1930  Jeremy Vargas is a 89 y.o. year old primary care patient of Hasanaj, Myra Gianotti, MD. I reached out to Jeremy Vargas by phone today to assist with scheduling a follow up appointment. Jeremy Vargas verbally consented to my assistance.       Follow up plan: Hospital Follow Up appointment scheduled with Toma Deiters, MD 07/31/23 at 1:00  [facility] Coordination Care Guide  Direct Dial: [phone]

## 2023-07-26 DIAGNOSIS — R54 Age-related physical debility: Secondary | ICD-10-CM | POA: Diagnosis not present

## 2023-07-26 DIAGNOSIS — R338 Other retention of urine: Secondary | ICD-10-CM | POA: Diagnosis not present

## 2023-07-26 DIAGNOSIS — S0990XA Unspecified injury of head, initial encounter: Secondary | ICD-10-CM | POA: Diagnosis not present

## 2023-07-26 DIAGNOSIS — N401 Enlarged prostate with lower urinary tract symptoms: Secondary | ICD-10-CM | POA: Diagnosis not present

## 2023-07-26 DIAGNOSIS — Z85828 Personal history of other malignant neoplasm of skin: Secondary | ICD-10-CM | POA: Diagnosis not present

## 2023-07-26 DIAGNOSIS — Z87891 Personal history of nicotine dependence: Secondary | ICD-10-CM | POA: Diagnosis not present

## 2023-07-26 DIAGNOSIS — Z66 Do not resuscitate: Secondary | ICD-10-CM | POA: Diagnosis not present

## 2023-07-26 DIAGNOSIS — R591 Generalized enlarged lymph nodes: Secondary | ICD-10-CM | POA: Diagnosis not present

## 2023-07-26 DIAGNOSIS — T83511D Infection and inflammatory reaction due to indwelling urethral catheter, subsequent encounter: Secondary | ICD-10-CM | POA: Diagnosis not present

## 2023-07-26 DIAGNOSIS — E43 Unspecified severe protein-calorie malnutrition: Secondary | ICD-10-CM | POA: Diagnosis not present

## 2023-07-26 DIAGNOSIS — Z7401 Bed confinement status: Secondary | ICD-10-CM | POA: Diagnosis not present

## 2023-07-26 DIAGNOSIS — Z87448 Personal history of other diseases of urinary system: Secondary | ICD-10-CM | POA: Diagnosis not present

## 2023-07-26 DIAGNOSIS — K5904 Chronic idiopathic constipation: Secondary | ICD-10-CM | POA: Diagnosis not present

## 2023-07-26 DIAGNOSIS — R109 Unspecified abdominal pain: Secondary | ICD-10-CM | POA: Diagnosis not present

## 2023-07-26 DIAGNOSIS — T83511A Infection and inflammatory reaction due to indwelling urethral catheter, initial encounter: Secondary | ICD-10-CM | POA: Diagnosis not present

## 2023-07-26 DIAGNOSIS — I6523 Occlusion and stenosis of bilateral carotid arteries: Secondary | ICD-10-CM | POA: Diagnosis not present

## 2023-07-26 DIAGNOSIS — Z7901 Long term (current) use of anticoagulants: Secondary | ICD-10-CM | POA: Diagnosis not present

## 2023-07-26 DIAGNOSIS — K5900 Constipation, unspecified: Secondary | ICD-10-CM | POA: Diagnosis not present

## 2023-07-26 DIAGNOSIS — N390 Urinary tract infection, site not specified: Secondary | ICD-10-CM | POA: Diagnosis not present

## 2023-07-26 DIAGNOSIS — I10 Essential (primary) hypertension: Secondary | ICD-10-CM | POA: Diagnosis not present

## 2023-07-26 DIAGNOSIS — J8410 Pulmonary fibrosis, unspecified: Secondary | ICD-10-CM | POA: Diagnosis not present

## 2023-07-26 DIAGNOSIS — D649 Anemia, unspecified: Secondary | ICD-10-CM | POA: Diagnosis present

## 2023-07-26 DIAGNOSIS — B965 Pseudomonas (aeruginosa) (mallei) (pseudomallei) as the cause of diseases classified elsewhere: Secondary | ICD-10-CM | POA: Diagnosis present

## 2023-07-26 DIAGNOSIS — Z7984 Long term (current) use of oral hypoglycemic drugs: Secondary | ICD-10-CM | POA: Diagnosis not present

## 2023-07-26 DIAGNOSIS — Z792 Long term (current) use of antibiotics: Secondary | ICD-10-CM | POA: Diagnosis not present

## 2023-07-26 DIAGNOSIS — R000 Tachycardia, unspecified: Secondary | ICD-10-CM | POA: Diagnosis not present

## 2023-07-26 DIAGNOSIS — R339 Retention of urine, unspecified: Secondary | ICD-10-CM | POA: Diagnosis not present

## 2023-07-26 DIAGNOSIS — R600 Localized edema: Secondary | ICD-10-CM | POA: Diagnosis not present

## 2023-07-26 DIAGNOSIS — E871 Hypo-osmolality and hyponatremia: Secondary | ICD-10-CM | POA: Diagnosis not present

## 2023-07-26 DIAGNOSIS — R5381 Other malaise: Secondary | ICD-10-CM | POA: Diagnosis not present

## 2023-07-26 DIAGNOSIS — G319 Degenerative disease of nervous system, unspecified: Secondary | ICD-10-CM | POA: Diagnosis not present

## 2023-07-26 DIAGNOSIS — S199XXA Unspecified injury of neck, initial encounter: Secondary | ICD-10-CM | POA: Diagnosis not present

## 2023-07-26 DIAGNOSIS — I700 Atherosclerosis of aorta: Secondary | ICD-10-CM | POA: Diagnosis not present

## 2023-07-26 DIAGNOSIS — J439 Emphysema, unspecified: Secondary | ICD-10-CM | POA: Diagnosis not present

## 2023-07-26 DIAGNOSIS — E119 Type 2 diabetes mellitus without complications: Secondary | ICD-10-CM | POA: Diagnosis not present

## 2023-07-26 DIAGNOSIS — Z6822 Body mass index (BMI) 22.0-22.9, adult: Secondary | ICD-10-CM | POA: Diagnosis not present

## 2023-07-26 DIAGNOSIS — R531 Weakness: Secondary | ICD-10-CM | POA: Diagnosis not present

## 2023-07-26 DIAGNOSIS — N3001 Acute cystitis with hematuria: Secondary | ICD-10-CM | POA: Diagnosis present

## 2023-07-26 DIAGNOSIS — I959 Hypotension, unspecified: Secondary | ICD-10-CM | POA: Diagnosis not present

## 2023-07-26 DIAGNOSIS — I4891 Unspecified atrial fibrillation: Secondary | ICD-10-CM | POA: Diagnosis not present

## 2023-07-26 DIAGNOSIS — N3000 Acute cystitis without hematuria: Secondary | ICD-10-CM | POA: Diagnosis not present

## 2023-07-26 DIAGNOSIS — R590 Localized enlarged lymph nodes: Secondary | ICD-10-CM | POA: Diagnosis present

## 2023-07-28 ENCOUNTER — Encounter: Payer: Self-pay | Admitting: *Deleted

## 2023-07-29 ENCOUNTER — Telehealth: Payer: Self-pay

## 2023-07-29 DIAGNOSIS — M47896 Other spondylosis, lumbar region: Secondary | ICD-10-CM | POA: Diagnosis not present

## 2023-07-29 DIAGNOSIS — R590 Localized enlarged lymph nodes: Secondary | ICD-10-CM | POA: Diagnosis not present

## 2023-07-29 DIAGNOSIS — I10 Essential (primary) hypertension: Secondary | ICD-10-CM | POA: Diagnosis not present

## 2023-07-29 DIAGNOSIS — E118 Type 2 diabetes mellitus with unspecified complications: Secondary | ICD-10-CM | POA: Diagnosis not present

## 2023-07-29 DIAGNOSIS — G629 Polyneuropathy, unspecified: Secondary | ICD-10-CM | POA: Diagnosis not present

## 2023-07-29 DIAGNOSIS — N3946 Mixed incontinence: Secondary | ICD-10-CM | POA: Diagnosis not present

## 2023-07-29 DIAGNOSIS — N401 Enlarged prostate with lower urinary tract symptoms: Secondary | ICD-10-CM | POA: Diagnosis not present

## 2023-07-29 DIAGNOSIS — R54 Age-related physical debility: Secondary | ICD-10-CM | POA: Diagnosis not present

## 2023-07-29 DIAGNOSIS — M6281 Muscle weakness (generalized): Secondary | ICD-10-CM | POA: Diagnosis not present

## 2023-07-29 DIAGNOSIS — Z87440 Personal history of urinary (tract) infections: Secondary | ICD-10-CM | POA: Diagnosis not present

## 2023-07-29 DIAGNOSIS — E46 Unspecified protein-calorie malnutrition: Secondary | ICD-10-CM | POA: Diagnosis not present

## 2023-07-29 DIAGNOSIS — R1310 Dysphagia, unspecified: Secondary | ICD-10-CM | POA: Diagnosis not present

## 2023-07-29 DIAGNOSIS — M810 Age-related osteoporosis without current pathological fracture: Secondary | ICD-10-CM | POA: Diagnosis not present

## 2023-07-29 NOTE — Telephone Encounter (Signed)
   Telephone encounter was:  Unsuccessful.  07/29/2023 Name: Yakov Laughton MRN: [id number] DOB: 10/20/1930  Unsuccessful outbound call made today to assist with:  Transportation Needs   Outreach Attempt:  2nd Attempt  A HIPAA compliant voice message was left requesting a return call.  Instructed patient to call back.    Lenard Forth [place]  [facility], [facility] Guide, Phone: [phone] Website: [email or web address]

## 2023-07-30 DIAGNOSIS — I10 Essential (primary) hypertension: Secondary | ICD-10-CM | POA: Diagnosis not present

## 2023-07-30 DIAGNOSIS — E46 Unspecified protein-calorie malnutrition: Secondary | ICD-10-CM | POA: Diagnosis not present

## 2023-07-30 DIAGNOSIS — Z87440 Personal history of urinary (tract) infections: Secondary | ICD-10-CM | POA: Diagnosis not present

## 2023-07-30 DIAGNOSIS — R590 Localized enlarged lymph nodes: Secondary | ICD-10-CM | POA: Diagnosis not present

## 2023-07-30 DIAGNOSIS — N401 Enlarged prostate with lower urinary tract symptoms: Secondary | ICD-10-CM | POA: Diagnosis not present

## 2023-07-30 DIAGNOSIS — R54 Age-related physical debility: Secondary | ICD-10-CM | POA: Diagnosis not present

## 2023-07-31 ENCOUNTER — Telehealth: Payer: Self-pay | Admitting: *Deleted

## 2023-07-31 ENCOUNTER — Telehealth: Payer: Self-pay

## 2023-07-31 NOTE — Telephone Encounter (Signed)
   Telephone encounter was:  Successful.  07/31/2023 Name: Jeremy Vargas MRN: [id number] DOB: 04/26/1930  [name] is a 89 y.o. year old male who is a primary care patient of Hasanaj, Myra Gianotti, MD . The community resource team was consulted for assistance with Transportation Needs   Care guide performed the following interventions: Patient provided with information about care guide support team and interviewed to confirm resource needs.Patient stated he has no needs for transportation at this time   Follow Up Plan:  No further follow up planned at this time. The patient has been provided with needed resources.    [name]  [facility], [facility] Guide, Phone: [phone] Website: [email or web address]

## 2023-07-31 NOTE — Progress Notes (Signed)
  Care Coordination Note  07/31/2023 Name: Bryceson Brodeur MRN: [id number] DOB: [birth year]  Tron Brenden is a 89 y.o. year old male who is a primary care patient of Hasanaj, Myra Gianotti, MD and is actively engaged with the care management team. I reached out to Corky Sox by phone today to assist with re-scheduling an initial visit with the RN Case Manager  Follow up plan: Patient declines further follow up and engagement by the care management team as he is actively engaged with Hospice.  [facility]  Care Coordination Care Guide  Direct Dial: [phone]

## 2023-08-01 DIAGNOSIS — Z87440 Personal history of urinary (tract) infections: Secondary | ICD-10-CM | POA: Diagnosis not present

## 2023-08-01 DIAGNOSIS — R590 Localized enlarged lymph nodes: Secondary | ICD-10-CM | POA: Diagnosis not present

## 2023-08-01 DIAGNOSIS — I10 Essential (primary) hypertension: Secondary | ICD-10-CM | POA: Diagnosis not present

## 2023-08-01 DIAGNOSIS — R54 Age-related physical debility: Secondary | ICD-10-CM | POA: Diagnosis not present

## 2023-08-01 DIAGNOSIS — E46 Unspecified protein-calorie malnutrition: Secondary | ICD-10-CM | POA: Diagnosis not present

## 2023-08-01 DIAGNOSIS — N401 Enlarged prostate with lower urinary tract symptoms: Secondary | ICD-10-CM | POA: Diagnosis not present

## 2023-08-04 DIAGNOSIS — R590 Localized enlarged lymph nodes: Secondary | ICD-10-CM | POA: Diagnosis not present

## 2023-08-04 DIAGNOSIS — R54 Age-related physical debility: Secondary | ICD-10-CM | POA: Diagnosis not present

## 2023-08-04 DIAGNOSIS — E46 Unspecified protein-calorie malnutrition: Secondary | ICD-10-CM | POA: Diagnosis not present

## 2023-08-04 DIAGNOSIS — I10 Essential (primary) hypertension: Secondary | ICD-10-CM | POA: Diagnosis not present

## 2023-08-04 DIAGNOSIS — Z87440 Personal history of urinary (tract) infections: Secondary | ICD-10-CM | POA: Diagnosis not present

## 2023-08-04 DIAGNOSIS — N401 Enlarged prostate with lower urinary tract symptoms: Secondary | ICD-10-CM | POA: Diagnosis not present

## 2023-08-05 DIAGNOSIS — N401 Enlarged prostate with lower urinary tract symptoms: Secondary | ICD-10-CM | POA: Diagnosis not present

## 2023-08-05 DIAGNOSIS — E46 Unspecified protein-calorie malnutrition: Secondary | ICD-10-CM | POA: Diagnosis not present

## 2023-08-05 DIAGNOSIS — R590 Localized enlarged lymph nodes: Secondary | ICD-10-CM | POA: Diagnosis not present

## 2023-08-05 DIAGNOSIS — R54 Age-related physical debility: Secondary | ICD-10-CM | POA: Diagnosis not present

## 2023-08-05 DIAGNOSIS — I10 Essential (primary) hypertension: Secondary | ICD-10-CM | POA: Diagnosis not present

## 2023-08-05 DIAGNOSIS — Z87440 Personal history of urinary (tract) infections: Secondary | ICD-10-CM | POA: Diagnosis not present

## 2023-08-06 DIAGNOSIS — E46 Unspecified protein-calorie malnutrition: Secondary | ICD-10-CM | POA: Diagnosis not present

## 2023-08-06 DIAGNOSIS — I10 Essential (primary) hypertension: Secondary | ICD-10-CM | POA: Diagnosis not present

## 2023-08-06 DIAGNOSIS — R590 Localized enlarged lymph nodes: Secondary | ICD-10-CM | POA: Diagnosis not present

## 2023-08-06 DIAGNOSIS — N401 Enlarged prostate with lower urinary tract symptoms: Secondary | ICD-10-CM | POA: Diagnosis not present

## 2023-08-06 DIAGNOSIS — Z87440 Personal history of urinary (tract) infections: Secondary | ICD-10-CM | POA: Diagnosis not present

## 2023-08-06 DIAGNOSIS — R54 Age-related physical debility: Secondary | ICD-10-CM | POA: Diagnosis not present

## 2023-08-07 DIAGNOSIS — R590 Localized enlarged lymph nodes: Secondary | ICD-10-CM | POA: Diagnosis not present

## 2023-08-07 DIAGNOSIS — R54 Age-related physical debility: Secondary | ICD-10-CM | POA: Diagnosis not present

## 2023-08-07 DIAGNOSIS — I10 Essential (primary) hypertension: Secondary | ICD-10-CM | POA: Diagnosis not present

## 2023-08-07 DIAGNOSIS — E46 Unspecified protein-calorie malnutrition: Secondary | ICD-10-CM | POA: Diagnosis not present

## 2023-08-07 DIAGNOSIS — Z87440 Personal history of urinary (tract) infections: Secondary | ICD-10-CM | POA: Diagnosis not present

## 2023-08-07 DIAGNOSIS — N401 Enlarged prostate with lower urinary tract symptoms: Secondary | ICD-10-CM | POA: Diagnosis not present

## 2023-08-08 DIAGNOSIS — N401 Enlarged prostate with lower urinary tract symptoms: Secondary | ICD-10-CM | POA: Diagnosis not present

## 2023-08-08 DIAGNOSIS — Z87440 Personal history of urinary (tract) infections: Secondary | ICD-10-CM | POA: Diagnosis not present

## 2023-08-08 DIAGNOSIS — R590 Localized enlarged lymph nodes: Secondary | ICD-10-CM | POA: Diagnosis not present

## 2023-08-08 DIAGNOSIS — E46 Unspecified protein-calorie malnutrition: Secondary | ICD-10-CM | POA: Diagnosis not present

## 2023-08-08 DIAGNOSIS — I10 Essential (primary) hypertension: Secondary | ICD-10-CM | POA: Diagnosis not present

## 2023-08-08 DIAGNOSIS — R54 Age-related physical debility: Secondary | ICD-10-CM | POA: Diagnosis not present

## 2023-08-11 DIAGNOSIS — N401 Enlarged prostate with lower urinary tract symptoms: Secondary | ICD-10-CM | POA: Diagnosis not present

## 2023-08-11 DIAGNOSIS — E46 Unspecified protein-calorie malnutrition: Secondary | ICD-10-CM | POA: Diagnosis not present

## 2023-08-11 DIAGNOSIS — Z87440 Personal history of urinary (tract) infections: Secondary | ICD-10-CM | POA: Diagnosis not present

## 2023-08-11 DIAGNOSIS — R590 Localized enlarged lymph nodes: Secondary | ICD-10-CM | POA: Diagnosis not present

## 2023-08-11 DIAGNOSIS — R54 Age-related physical debility: Secondary | ICD-10-CM | POA: Diagnosis not present

## 2023-08-11 DIAGNOSIS — I10 Essential (primary) hypertension: Secondary | ICD-10-CM | POA: Diagnosis not present

## 2023-08-12 DIAGNOSIS — Z87440 Personal history of urinary (tract) infections: Secondary | ICD-10-CM | POA: Diagnosis not present

## 2023-08-12 DIAGNOSIS — E46 Unspecified protein-calorie malnutrition: Secondary | ICD-10-CM | POA: Diagnosis not present

## 2023-08-12 DIAGNOSIS — N401 Enlarged prostate with lower urinary tract symptoms: Secondary | ICD-10-CM | POA: Diagnosis not present

## 2023-08-12 DIAGNOSIS — R54 Age-related physical debility: Secondary | ICD-10-CM | POA: Diagnosis not present

## 2023-08-12 DIAGNOSIS — I10 Essential (primary) hypertension: Secondary | ICD-10-CM | POA: Diagnosis not present

## 2023-08-12 DIAGNOSIS — R590 Localized enlarged lymph nodes: Secondary | ICD-10-CM | POA: Diagnosis not present

## 2023-08-13 DIAGNOSIS — Z87440 Personal history of urinary (tract) infections: Secondary | ICD-10-CM | POA: Diagnosis not present

## 2023-08-13 DIAGNOSIS — R590 Localized enlarged lymph nodes: Secondary | ICD-10-CM | POA: Diagnosis not present

## 2023-08-13 DIAGNOSIS — R54 Age-related physical debility: Secondary | ICD-10-CM | POA: Diagnosis not present

## 2023-08-13 DIAGNOSIS — I10 Essential (primary) hypertension: Secondary | ICD-10-CM | POA: Diagnosis not present

## 2023-08-13 DIAGNOSIS — E46 Unspecified protein-calorie malnutrition: Secondary | ICD-10-CM | POA: Diagnosis not present

## 2023-08-13 DIAGNOSIS — N401 Enlarged prostate with lower urinary tract symptoms: Secondary | ICD-10-CM | POA: Diagnosis not present

## 2023-08-14 DIAGNOSIS — I959 Hypotension, unspecified: Secondary | ICD-10-CM | POA: Diagnosis not present

## 2023-08-14 DIAGNOSIS — K625 Hemorrhage of anus and rectum: Secondary | ICD-10-CM | POA: Diagnosis not present

## 2023-08-14 DIAGNOSIS — D689 Coagulation defect, unspecified: Secondary | ICD-10-CM | POA: Diagnosis not present

## 2023-08-14 DIAGNOSIS — E43 Unspecified severe protein-calorie malnutrition: Secondary | ICD-10-CM | POA: Diagnosis not present

## 2023-08-14 DIAGNOSIS — I700 Atherosclerosis of aorta: Secondary | ICD-10-CM | POA: Diagnosis not present

## 2023-08-14 DIAGNOSIS — R58 Hemorrhage, not elsewhere classified: Secondary | ICD-10-CM | POA: Diagnosis not present

## 2023-08-14 DIAGNOSIS — R0902 Hypoxemia: Secondary | ICD-10-CM | POA: Diagnosis not present

## 2023-08-14 DIAGNOSIS — K921 Melena: Secondary | ICD-10-CM | POA: Diagnosis not present

## 2023-08-14 DIAGNOSIS — R791 Abnormal coagulation profile: Secondary | ICD-10-CM | POA: Diagnosis not present

## 2023-08-14 DIAGNOSIS — I4821 Permanent atrial fibrillation: Secondary | ICD-10-CM | POA: Diagnosis not present

## 2023-08-14 DIAGNOSIS — K922 Gastrointestinal hemorrhage, unspecified: Secondary | ICD-10-CM | POA: Diagnosis not present

## 2023-08-15 DIAGNOSIS — Z66 Do not resuscitate: Secondary | ICD-10-CM | POA: Diagnosis not present

## 2023-08-15 DIAGNOSIS — R54 Age-related physical debility: Secondary | ICD-10-CM | POA: Diagnosis not present

## 2023-08-15 DIAGNOSIS — E43 Unspecified severe protein-calorie malnutrition: Secondary | ICD-10-CM | POA: Diagnosis not present

## 2023-08-15 DIAGNOSIS — R791 Abnormal coagulation profile: Secondary | ICD-10-CM | POA: Diagnosis not present

## 2023-08-15 DIAGNOSIS — K922 Gastrointestinal hemorrhage, unspecified: Secondary | ICD-10-CM | POA: Diagnosis not present

## 2023-08-15 DIAGNOSIS — I4821 Permanent atrial fibrillation: Secondary | ICD-10-CM | POA: Diagnosis not present

## 2023-08-15 NOTE — Progress Notes (Incomplete)
History of Present Illness: 89 yo male here for continued f/u of BPH w/ LUTS, now catheter dependent.  Past Medical History:  Diagnosis Date   Atrial fibrillation (HCC)    Cardiac dysrhythmia, unspecified    Compression fracture of L4 lumbar vertebra    Compression fracture of T12 vertebra (HCC)    Diabetes mellitus without complication (HCC)    Other and unspecified hyperlipidemia    Sleep apnea    SVT (supraventricular tachycardia)    Unspecified essential hypertension    Vitamin D deficiency     Past Surgical History:  Procedure Laterality Date   ABLATION     x2   CARDIOVERSION     x2   HIATAL HERNIA REPAIR     x2   KYPHOPLASTY     SKIN CANCER EXCISION      Home Medications:  Allergies as of 08/18/2023       Reactions   Betadine [povidone Iodine] Rash        Medication List        Accurate as of August 15, 2023 10:41 AM. If you have any questions, ask your nurse or doctor.          BIOTIN PO Take 1 capsule by mouth daily.   diltiazem 240 MG 24 hr capsule Commonly known as: CARDIZEM CD TAKE ONE (1) CAPSULE EACH DAY   Fish Oil 1000 MG Caps Take 1 capsule by mouth daily.   furosemide 40 MG tablet Commonly known as: LASIX Take 40 mg by mouth daily.   metFORMIN 1000 MG tablet Commonly known as: GLUCOPHAGE Take 1,000 mg by mouth 2 (two) times daily with a meal.   multivitamin tablet Take 1 tablet by mouth daily.   tamsulosin 0.4 MG Caps capsule Commonly known as: FLOMAX Take 1 capsule (0.4 mg total) by mouth in the morning and at bedtime.   warfarin 5 MG tablet Commonly known as: COUMADIN Take 5 mg by mouth daily.        Allergies:  Allergies  Allergen Reactions   Betadine [Povidone Iodine] Rash    Family History  Problem Relation Age of Onset   COPD Mother    Cancer Father    Heart disease Son    Heart disease Maternal Grandmother     Social History:  reports that he has quit smoking. He has never used smokeless tobacco. He  reports that he does not drink alcohol and does not use drugs.  ROS: A complete review of systems was performed.  All systems are negative except for pertinent findings as noted.  Physical Exam:  Vital signs in last 24 hours: There were no vitals taken for this visit. Constitutional:  Alert and oriented, No acute distress Cardiovascular: Regular rate  Respiratory: Normal respiratory effort GI: Abdomen is soft, nontender, nondistended, no abdominal masses. No CVAT.  Genitourinary: Normal male phallus, testes are descended bilaterally and non-tender and without masses, scrotum is normal in appearance without lesions or masses, perineum is normal on inspection. Lymphatic: No lymphadenopathy Neurologic: Grossly intact, no focal deficits Psychiatric: Normal mood and affect  I have reviewed prior pt notes  I have reviewed notes from referring/previous physicians  I have reviewed urinalysis results  I have independently reviewed prior imaging  I have reviewed prior PSA results  I have reviewed prior urine culture   Impression/Assessment:  ***  Plan:  ***

## 2023-08-16 DIAGNOSIS — R410 Disorientation, unspecified: Secondary | ICD-10-CM | POA: Diagnosis not present

## 2023-08-16 DIAGNOSIS — R0902 Hypoxemia: Secondary | ICD-10-CM | POA: Diagnosis not present

## 2023-08-16 DIAGNOSIS — E46 Unspecified protein-calorie malnutrition: Secondary | ICD-10-CM | POA: Diagnosis not present

## 2023-08-16 DIAGNOSIS — I10 Essential (primary) hypertension: Secondary | ICD-10-CM | POA: Diagnosis not present

## 2023-08-16 DIAGNOSIS — Z87440 Personal history of urinary (tract) infections: Secondary | ICD-10-CM | POA: Diagnosis not present

## 2023-08-16 DIAGNOSIS — Z7401 Bed confinement status: Secondary | ICD-10-CM | POA: Diagnosis not present

## 2023-08-16 DIAGNOSIS — N401 Enlarged prostate with lower urinary tract symptoms: Secondary | ICD-10-CM | POA: Diagnosis not present

## 2023-08-16 DIAGNOSIS — R590 Localized enlarged lymph nodes: Secondary | ICD-10-CM | POA: Diagnosis not present

## 2023-08-16 DIAGNOSIS — R58 Hemorrhage, not elsewhere classified: Secondary | ICD-10-CM | POA: Diagnosis not present

## 2023-08-16 DIAGNOSIS — R54 Age-related physical debility: Secondary | ICD-10-CM | POA: Diagnosis not present

## 2023-08-17 DIAGNOSIS — I10 Essential (primary) hypertension: Secondary | ICD-10-CM | POA: Diagnosis not present

## 2023-08-17 DIAGNOSIS — E46 Unspecified protein-calorie malnutrition: Secondary | ICD-10-CM | POA: Diagnosis not present

## 2023-08-17 DIAGNOSIS — Z87440 Personal history of urinary (tract) infections: Secondary | ICD-10-CM | POA: Diagnosis not present

## 2023-08-17 DIAGNOSIS — R590 Localized enlarged lymph nodes: Secondary | ICD-10-CM | POA: Diagnosis not present

## 2023-08-17 DIAGNOSIS — R54 Age-related physical debility: Secondary | ICD-10-CM | POA: Diagnosis not present

## 2023-08-17 DIAGNOSIS — N401 Enlarged prostate with lower urinary tract symptoms: Secondary | ICD-10-CM | POA: Diagnosis not present

## 2023-08-18 ENCOUNTER — Other Ambulatory Visit: Payer: Medicare Other | Admitting: Urology

## 2023-08-18 DIAGNOSIS — N401 Enlarged prostate with lower urinary tract symptoms: Secondary | ICD-10-CM | POA: Diagnosis not present

## 2023-08-18 DIAGNOSIS — I10 Essential (primary) hypertension: Secondary | ICD-10-CM | POA: Diagnosis not present

## 2023-08-18 DIAGNOSIS — R54 Age-related physical debility: Secondary | ICD-10-CM | POA: Diagnosis not present

## 2023-08-18 DIAGNOSIS — Z87440 Personal history of urinary (tract) infections: Secondary | ICD-10-CM | POA: Diagnosis not present

## 2023-08-18 DIAGNOSIS — K5900 Constipation, unspecified: Secondary | ICD-10-CM

## 2023-08-18 DIAGNOSIS — E46 Unspecified protein-calorie malnutrition: Secondary | ICD-10-CM | POA: Diagnosis not present

## 2023-08-18 DIAGNOSIS — R590 Localized enlarged lymph nodes: Secondary | ICD-10-CM | POA: Diagnosis not present

## 2023-08-18 DIAGNOSIS — R339 Retention of urine, unspecified: Secondary | ICD-10-CM

## 2023-08-19 DIAGNOSIS — R590 Localized enlarged lymph nodes: Secondary | ICD-10-CM | POA: Diagnosis not present

## 2023-08-19 DIAGNOSIS — M810 Age-related osteoporosis without current pathological fracture: Secondary | ICD-10-CM | POA: Diagnosis not present

## 2023-08-19 DIAGNOSIS — E118 Type 2 diabetes mellitus with unspecified complications: Secondary | ICD-10-CM | POA: Diagnosis not present

## 2023-08-19 DIAGNOSIS — N3946 Mixed incontinence: Secondary | ICD-10-CM | POA: Diagnosis not present

## 2023-08-19 DIAGNOSIS — M47896 Other spondylosis, lumbar region: Secondary | ICD-10-CM | POA: Diagnosis not present

## 2023-08-19 DIAGNOSIS — E46 Unspecified protein-calorie malnutrition: Secondary | ICD-10-CM | POA: Diagnosis not present

## 2023-08-19 DIAGNOSIS — Z87440 Personal history of urinary (tract) infections: Secondary | ICD-10-CM | POA: Diagnosis not present

## 2023-08-19 DIAGNOSIS — G629 Polyneuropathy, unspecified: Secondary | ICD-10-CM | POA: Diagnosis not present

## 2023-08-19 DIAGNOSIS — I10 Essential (primary) hypertension: Secondary | ICD-10-CM | POA: Diagnosis not present

## 2023-08-19 DIAGNOSIS — R1310 Dysphagia, unspecified: Secondary | ICD-10-CM | POA: Diagnosis not present

## 2023-08-19 DIAGNOSIS — M6281 Muscle weakness (generalized): Secondary | ICD-10-CM | POA: Diagnosis not present

## 2023-08-19 DIAGNOSIS — R54 Age-related physical debility: Secondary | ICD-10-CM | POA: Diagnosis not present

## 2023-08-19 DIAGNOSIS — N401 Enlarged prostate with lower urinary tract symptoms: Secondary | ICD-10-CM | POA: Diagnosis not present

## 2023-08-21 DIAGNOSIS — R54 Age-related physical debility: Secondary | ICD-10-CM | POA: Diagnosis not present

## 2023-08-21 DIAGNOSIS — N401 Enlarged prostate with lower urinary tract symptoms: Secondary | ICD-10-CM | POA: Diagnosis not present

## 2023-08-21 DIAGNOSIS — I10 Essential (primary) hypertension: Secondary | ICD-10-CM | POA: Diagnosis not present

## 2023-08-21 DIAGNOSIS — E46 Unspecified protein-calorie malnutrition: Secondary | ICD-10-CM | POA: Diagnosis not present

## 2023-08-21 DIAGNOSIS — Z87440 Personal history of urinary (tract) infections: Secondary | ICD-10-CM | POA: Diagnosis not present

## 2023-08-21 DIAGNOSIS — R590 Localized enlarged lymph nodes: Secondary | ICD-10-CM | POA: Diagnosis not present

## 2023-08-22 DIAGNOSIS — N401 Enlarged prostate with lower urinary tract symptoms: Secondary | ICD-10-CM | POA: Diagnosis not present

## 2023-08-22 DIAGNOSIS — Z87440 Personal history of urinary (tract) infections: Secondary | ICD-10-CM | POA: Diagnosis not present

## 2023-08-22 DIAGNOSIS — R590 Localized enlarged lymph nodes: Secondary | ICD-10-CM | POA: Diagnosis not present

## 2023-08-22 DIAGNOSIS — E46 Unspecified protein-calorie malnutrition: Secondary | ICD-10-CM | POA: Diagnosis not present

## 2023-08-22 DIAGNOSIS — I10 Essential (primary) hypertension: Secondary | ICD-10-CM | POA: Diagnosis not present

## 2023-08-22 DIAGNOSIS — R54 Age-related physical debility: Secondary | ICD-10-CM | POA: Diagnosis not present

## 2023-08-25 DIAGNOSIS — N401 Enlarged prostate with lower urinary tract symptoms: Secondary | ICD-10-CM | POA: Diagnosis not present

## 2023-08-25 DIAGNOSIS — R54 Age-related physical debility: Secondary | ICD-10-CM | POA: Diagnosis not present

## 2023-08-25 DIAGNOSIS — Z87440 Personal history of urinary (tract) infections: Secondary | ICD-10-CM | POA: Diagnosis not present

## 2023-08-25 DIAGNOSIS — I10 Essential (primary) hypertension: Secondary | ICD-10-CM | POA: Diagnosis not present

## 2023-08-25 DIAGNOSIS — R590 Localized enlarged lymph nodes: Secondary | ICD-10-CM | POA: Diagnosis not present

## 2023-08-25 DIAGNOSIS — E46 Unspecified protein-calorie malnutrition: Secondary | ICD-10-CM | POA: Diagnosis not present

## 2023-08-26 DIAGNOSIS — R590 Localized enlarged lymph nodes: Secondary | ICD-10-CM | POA: Diagnosis not present

## 2023-08-26 DIAGNOSIS — Z87440 Personal history of urinary (tract) infections: Secondary | ICD-10-CM | POA: Diagnosis not present

## 2023-08-26 DIAGNOSIS — R54 Age-related physical debility: Secondary | ICD-10-CM | POA: Diagnosis not present

## 2023-08-26 DIAGNOSIS — N401 Enlarged prostate with lower urinary tract symptoms: Secondary | ICD-10-CM | POA: Diagnosis not present

## 2023-08-26 DIAGNOSIS — E46 Unspecified protein-calorie malnutrition: Secondary | ICD-10-CM | POA: Diagnosis not present

## 2023-08-26 DIAGNOSIS — I10 Essential (primary) hypertension: Secondary | ICD-10-CM | POA: Diagnosis not present

## 2023-08-28 DIAGNOSIS — N401 Enlarged prostate with lower urinary tract symptoms: Secondary | ICD-10-CM | POA: Diagnosis not present

## 2023-08-28 DIAGNOSIS — E46 Unspecified protein-calorie malnutrition: Secondary | ICD-10-CM | POA: Diagnosis not present

## 2023-08-28 DIAGNOSIS — I10 Essential (primary) hypertension: Secondary | ICD-10-CM | POA: Diagnosis not present

## 2023-08-28 DIAGNOSIS — Z87440 Personal history of urinary (tract) infections: Secondary | ICD-10-CM | POA: Diagnosis not present

## 2023-08-28 DIAGNOSIS — R590 Localized enlarged lymph nodes: Secondary | ICD-10-CM | POA: Diagnosis not present

## 2023-08-28 DIAGNOSIS — R54 Age-related physical debility: Secondary | ICD-10-CM | POA: Diagnosis not present

## 2023-08-29 DIAGNOSIS — I10 Essential (primary) hypertension: Secondary | ICD-10-CM | POA: Diagnosis not present

## 2023-08-29 DIAGNOSIS — E46 Unspecified protein-calorie malnutrition: Secondary | ICD-10-CM | POA: Diagnosis not present

## 2023-08-29 DIAGNOSIS — R54 Age-related physical debility: Secondary | ICD-10-CM | POA: Diagnosis not present

## 2023-08-29 DIAGNOSIS — N401 Enlarged prostate with lower urinary tract symptoms: Secondary | ICD-10-CM | POA: Diagnosis not present

## 2023-08-29 DIAGNOSIS — Z87440 Personal history of urinary (tract) infections: Secondary | ICD-10-CM | POA: Diagnosis not present

## 2023-08-29 DIAGNOSIS — R590 Localized enlarged lymph nodes: Secondary | ICD-10-CM | POA: Diagnosis not present

## 2023-08-31 DIAGNOSIS — I10 Essential (primary) hypertension: Secondary | ICD-10-CM | POA: Diagnosis not present

## 2023-08-31 DIAGNOSIS — R590 Localized enlarged lymph nodes: Secondary | ICD-10-CM | POA: Diagnosis not present

## 2023-08-31 DIAGNOSIS — E46 Unspecified protein-calorie malnutrition: Secondary | ICD-10-CM | POA: Diagnosis not present

## 2023-08-31 DIAGNOSIS — R54 Age-related physical debility: Secondary | ICD-10-CM | POA: Diagnosis not present

## 2023-08-31 DIAGNOSIS — Z87440 Personal history of urinary (tract) infections: Secondary | ICD-10-CM | POA: Diagnosis not present

## 2023-08-31 DIAGNOSIS — N401 Enlarged prostate with lower urinary tract symptoms: Secondary | ICD-10-CM | POA: Diagnosis not present

## 2023-09-01 DIAGNOSIS — E46 Unspecified protein-calorie malnutrition: Secondary | ICD-10-CM | POA: Diagnosis not present

## 2023-09-01 DIAGNOSIS — N401 Enlarged prostate with lower urinary tract symptoms: Secondary | ICD-10-CM | POA: Diagnosis not present

## 2023-09-01 DIAGNOSIS — R590 Localized enlarged lymph nodes: Secondary | ICD-10-CM | POA: Diagnosis not present

## 2023-09-01 DIAGNOSIS — Z87440 Personal history of urinary (tract) infections: Secondary | ICD-10-CM | POA: Diagnosis not present

## 2023-09-01 DIAGNOSIS — I10 Essential (primary) hypertension: Secondary | ICD-10-CM | POA: Diagnosis not present

## 2023-09-01 DIAGNOSIS — R54 Age-related physical debility: Secondary | ICD-10-CM | POA: Diagnosis not present

## 2023-09-02 DIAGNOSIS — R590 Localized enlarged lymph nodes: Secondary | ICD-10-CM | POA: Diagnosis not present

## 2023-09-02 DIAGNOSIS — E46 Unspecified protein-calorie malnutrition: Secondary | ICD-10-CM | POA: Diagnosis not present

## 2023-09-02 DIAGNOSIS — Z87440 Personal history of urinary (tract) infections: Secondary | ICD-10-CM | POA: Diagnosis not present

## 2023-09-02 DIAGNOSIS — N401 Enlarged prostate with lower urinary tract symptoms: Secondary | ICD-10-CM | POA: Diagnosis not present

## 2023-09-02 DIAGNOSIS — I10 Essential (primary) hypertension: Secondary | ICD-10-CM | POA: Diagnosis not present

## 2023-09-02 DIAGNOSIS — R54 Age-related physical debility: Secondary | ICD-10-CM | POA: Diagnosis not present

## 2023-09-03 DIAGNOSIS — R54 Age-related physical debility: Secondary | ICD-10-CM | POA: Diagnosis not present

## 2023-09-03 DIAGNOSIS — I10 Essential (primary) hypertension: Secondary | ICD-10-CM | POA: Diagnosis not present

## 2023-09-03 DIAGNOSIS — R590 Localized enlarged lymph nodes: Secondary | ICD-10-CM | POA: Diagnosis not present

## 2023-09-03 DIAGNOSIS — Z87440 Personal history of urinary (tract) infections: Secondary | ICD-10-CM | POA: Diagnosis not present

## 2023-09-03 DIAGNOSIS — E46 Unspecified protein-calorie malnutrition: Secondary | ICD-10-CM | POA: Diagnosis not present

## 2023-09-03 DIAGNOSIS — N401 Enlarged prostate with lower urinary tract symptoms: Secondary | ICD-10-CM | POA: Diagnosis not present

## 2023-09-04 DIAGNOSIS — R54 Age-related physical debility: Secondary | ICD-10-CM | POA: Diagnosis not present

## 2023-09-04 DIAGNOSIS — Z87440 Personal history of urinary (tract) infections: Secondary | ICD-10-CM | POA: Diagnosis not present

## 2023-09-04 DIAGNOSIS — E46 Unspecified protein-calorie malnutrition: Secondary | ICD-10-CM | POA: Diagnosis not present

## 2023-09-04 DIAGNOSIS — R590 Localized enlarged lymph nodes: Secondary | ICD-10-CM | POA: Diagnosis not present

## 2023-09-04 DIAGNOSIS — N401 Enlarged prostate with lower urinary tract symptoms: Secondary | ICD-10-CM | POA: Diagnosis not present

## 2023-09-04 DIAGNOSIS — I10 Essential (primary) hypertension: Secondary | ICD-10-CM | POA: Diagnosis not present

## 2023-09-05 DIAGNOSIS — R54 Age-related physical debility: Secondary | ICD-10-CM | POA: Diagnosis not present

## 2023-09-05 DIAGNOSIS — Z87440 Personal history of urinary (tract) infections: Secondary | ICD-10-CM | POA: Diagnosis not present

## 2023-09-05 DIAGNOSIS — I10 Essential (primary) hypertension: Secondary | ICD-10-CM | POA: Diagnosis not present

## 2023-09-05 DIAGNOSIS — E46 Unspecified protein-calorie malnutrition: Secondary | ICD-10-CM | POA: Diagnosis not present

## 2023-09-05 DIAGNOSIS — N401 Enlarged prostate with lower urinary tract symptoms: Secondary | ICD-10-CM | POA: Diagnosis not present

## 2023-09-05 DIAGNOSIS — R590 Localized enlarged lymph nodes: Secondary | ICD-10-CM | POA: Diagnosis not present
